# Patient Record
Sex: Female | Born: 1980 | Race: White | Hispanic: No | Smoking: Current every day smoker
Health system: Southern US, Community
[De-identification: ages and names within clinical notes are randomized; demographics above are authoritative.]

## PROBLEM LIST (undated history)

## (undated) DIAGNOSIS — J449 Chronic obstructive pulmonary disease, unspecified: Secondary | ICD-10-CM

## (undated) DIAGNOSIS — F419 Anxiety disorder, unspecified: Secondary | ICD-10-CM

## (undated) DIAGNOSIS — M797 Fibromyalgia: Secondary | ICD-10-CM

## (undated) DIAGNOSIS — N200 Calculus of kidney: Secondary | ICD-10-CM

## (undated) DIAGNOSIS — N390 Urinary tract infection, site not specified: Secondary | ICD-10-CM

## (undated) DIAGNOSIS — N289 Disorder of kidney and ureter, unspecified: Secondary | ICD-10-CM

## (undated) HISTORY — PX: DENTAL SURGERY: SHX609

## (undated) HISTORY — PX: EYE SURGERY: SHX253

## (undated) HISTORY — PX: TUBAL LIGATION: SHX77

---

## 2005-03-06 ENCOUNTER — Emergency Department: Payer: Self-pay | Admitting: Emergency Medicine

## 2005-04-11 ENCOUNTER — Emergency Department: Payer: Self-pay | Admitting: Unknown Physician Specialty

## 2005-05-12 ENCOUNTER — Inpatient Hospital Stay (HOSPITAL_COMMUNITY): Admission: AD | Admit: 2005-05-12 | Discharge: 2005-05-12 | Payer: Self-pay | Admitting: Obstetrics and Gynecology

## 2006-02-20 ENCOUNTER — Ambulatory Visit: Payer: Self-pay | Admitting: Obstetrics & Gynecology

## 2006-03-06 ENCOUNTER — Ambulatory Visit: Payer: Self-pay | Admitting: Obstetrics & Gynecology

## 2006-03-19 ENCOUNTER — Ambulatory Visit (HOSPITAL_COMMUNITY): Admission: RE | Admit: 2006-03-19 | Discharge: 2006-03-19 | Payer: Self-pay | Admitting: Obstetrics and Gynecology

## 2006-04-05 ENCOUNTER — Other Ambulatory Visit: Payer: Self-pay

## 2006-04-05 ENCOUNTER — Emergency Department: Payer: Self-pay | Admitting: Emergency Medicine

## 2008-02-26 ENCOUNTER — Emergency Department: Payer: Self-pay | Admitting: Emergency Medicine

## 2009-02-14 ENCOUNTER — Ambulatory Visit: Payer: Self-pay | Admitting: Family Medicine

## 2009-05-10 ENCOUNTER — Emergency Department: Payer: Self-pay | Admitting: Emergency Medicine

## 2009-08-07 ENCOUNTER — Ambulatory Visit: Payer: Self-pay | Admitting: Internal Medicine

## 2010-05-08 ENCOUNTER — Emergency Department: Payer: Self-pay | Admitting: Emergency Medicine

## 2010-06-28 ENCOUNTER — Emergency Department: Payer: Self-pay | Admitting: Internal Medicine

## 2010-07-21 ENCOUNTER — Emergency Department: Payer: Self-pay | Admitting: Emergency Medicine

## 2010-10-05 ENCOUNTER — Emergency Department: Payer: Self-pay | Admitting: Emergency Medicine

## 2010-10-29 ENCOUNTER — Encounter: Payer: Self-pay | Admitting: Obstetrics and Gynecology

## 2010-11-28 ENCOUNTER — Observation Stay: Payer: Self-pay

## 2011-02-16 ENCOUNTER — Observation Stay: Payer: Self-pay | Admitting: Obstetrics and Gynecology

## 2011-03-02 ENCOUNTER — Observation Stay: Payer: Self-pay | Admitting: Obstetrics & Gynecology

## 2011-10-14 ENCOUNTER — Emergency Department: Payer: Self-pay | Admitting: Unknown Physician Specialty

## 2012-01-15 ENCOUNTER — Emergency Department: Payer: Self-pay | Admitting: Emergency Medicine

## 2012-01-15 LAB — COMPREHENSIVE METABOLIC PANEL
Albumin: 4.2 g/dL (ref 3.4–5.0)
Alkaline Phosphatase: 50 U/L (ref 50–136)
Anion Gap: 9 (ref 7–16)
BUN: 11 mg/dL (ref 7–18)
Bilirubin,Total: 0.3 mg/dL (ref 0.2–1.0)
Calcium, Total: 9.2 mg/dL (ref 8.5–10.1)
Chloride: 105 mmol/L (ref 98–107)
Co2: 27 mmol/L (ref 21–32)
Creatinine: 0.7 mg/dL (ref 0.60–1.30)
EGFR (African American): >60
EGFR (Non-African Amer.): >60
Glucose: 95 mg/dL (ref 65–99)
Osmolality: 280 (ref 275–301)
Potassium: 3.7 mmol/L (ref 3.5–5.1)
SGOT(AST): 20 U/L (ref 15–37)
SGPT (ALT): 29 U/L
Sodium: 141 mmol/L (ref 136–145)
Total Protein: 7.9 g/dL (ref 6.4–8.2)

## 2012-01-15 LAB — URINALYSIS, COMPLETE
Bilirubin,UR: NEGATIVE
Blood: NEGATIVE
Glucose,UR: NEGATIVE mg/dL (ref 0–75)
Ketone: NEGATIVE
Nitrite: POSITIVE
Ph: 6 (ref 4.5–8.0)
Protein: NEGATIVE
RBC,UR: 6 /HPF (ref 0–5)
Specific Gravity: 1.019 (ref 1.003–1.030)
Squamous Epithelial: 4
WBC UR: 28 /HPF (ref 0–5)

## 2012-01-15 LAB — CBC
HCT: 39.1 % (ref 35.0–47.0)
HGB: 13.3 g/dL (ref 12.0–16.0)
MCH: 32.2 pg (ref 26.0–34.0)
MCHC: 33.9 g/dL (ref 32.0–36.0)
MCV: 95 fL (ref 80–100)
Platelet: 331 10*3/uL (ref 150–440)
RBC: 4.13 10*6/uL (ref 3.80–5.20)
RDW: 14.6 % — ABNORMAL HIGH (ref 11.5–14.5)
WBC: 14.7 10*3/uL — ABNORMAL HIGH (ref 3.6–11.0)

## 2012-01-15 LAB — PREGNANCY, URINE: Pregnancy Test, Urine: NEGATIVE m[IU]/mL

## 2012-01-15 LAB — LIPASE, BLOOD: Lipase: 148 U/L (ref 73–393)

## 2012-02-12 ENCOUNTER — Emergency Department: Payer: Self-pay | Admitting: Emergency Medicine

## 2012-07-28 ENCOUNTER — Emergency Department: Payer: Self-pay | Admitting: Emergency Medicine

## 2012-08-01 LAB — WOUND CULTURE

## 2012-08-24 DIAGNOSIS — K589 Irritable bowel syndrome without diarrhea: Secondary | ICD-10-CM | POA: Insufficient documentation

## 2012-09-14 ENCOUNTER — Emergency Department: Payer: Self-pay | Admitting: Emergency Medicine

## 2012-09-14 LAB — URINALYSIS, COMPLETE
Bacteria: NONE SEEN
Bilirubin,UR: NEGATIVE
Blood: NEGATIVE
Glucose,UR: NEGATIVE mg/dL (ref 0–75)
Ketone: NEGATIVE
Nitrite: NEGATIVE
Ph: 6 (ref 4.5–8.0)
Protein: NEGATIVE
RBC,UR: 6 /HPF (ref 0–5)
Specific Gravity: 1.024 (ref 1.003–1.030)
Squamous Epithelial: 3
WBC UR: 5 /HPF (ref 0–5)

## 2012-11-27 ENCOUNTER — Emergency Department: Payer: Self-pay | Admitting: Emergency Medicine

## 2012-11-27 LAB — COMPREHENSIVE METABOLIC PANEL
Albumin: 4 g/dL (ref 3.4–5.0)
Alkaline Phosphatase: 59 U/L (ref 50–136)
Anion Gap: 7 (ref 7–16)
BUN: 11 mg/dL (ref 7–18)
Bilirubin,Total: 0.5 mg/dL (ref 0.2–1.0)
Calcium, Total: 8.8 mg/dL (ref 8.5–10.1)
Chloride: 108 mmol/L — ABNORMAL HIGH (ref 98–107)
Co2: 27 mmol/L (ref 21–32)
Creatinine: 0.74 mg/dL (ref 0.60–1.30)
EGFR (African American): >60
EGFR (Non-African Amer.): >60
Glucose: 89 mg/dL (ref 65–99)
Osmolality: 282 (ref 275–301)
Potassium: 3.5 mmol/L (ref 3.5–5.1)
SGOT(AST): 24 U/L (ref 15–37)
SGPT (ALT): 25 U/L (ref 12–78)
Sodium: 142 mmol/L (ref 136–145)
Total Protein: 7.5 g/dL (ref 6.4–8.2)

## 2012-11-27 LAB — URINALYSIS, COMPLETE
Bacteria: NONE SEEN
Bilirubin,UR: NEGATIVE
Blood: NEGATIVE
Glucose,UR: NEGATIVE mg/dL (ref 0–75)
Nitrite: NEGATIVE
Ph: 6 (ref 4.5–8.0)
Protein: NEGATIVE
RBC,UR: 7 /HPF (ref 0–5)
Specific Gravity: 1.024 (ref 1.003–1.030)
Squamous Epithelial: 15
WBC UR: 35 /HPF (ref 0–5)

## 2012-11-27 LAB — CBC
HCT: 38.1 % (ref 35.0–47.0)
HGB: 13.5 g/dL (ref 12.0–16.0)
MCH: 33.8 pg (ref 26.0–34.0)
MCHC: 35.4 g/dL (ref 32.0–36.0)
MCV: 95 fL (ref 80–100)
Platelet: 309 10*3/uL (ref 150–440)
RBC: 4 10*6/uL (ref 3.80–5.20)
RDW: 13.4 % (ref 11.5–14.5)
WBC: 10.1 10*3/uL (ref 3.6–11.0)

## 2012-11-27 LAB — PREGNANCY, URINE: Pregnancy Test, Urine: NEGATIVE m[IU]/mL

## 2012-11-27 LAB — RAPID INFLUENZA A&B ANTIGENS

## 2012-12-22 DIAGNOSIS — M545 Low back pain, unspecified: Secondary | ICD-10-CM | POA: Insufficient documentation

## 2013-01-05 DIAGNOSIS — K625 Hemorrhage of anus and rectum: Secondary | ICD-10-CM | POA: Insufficient documentation

## 2013-06-21 DIAGNOSIS — M797 Fibromyalgia: Secondary | ICD-10-CM | POA: Insufficient documentation

## 2013-07-15 ENCOUNTER — Emergency Department: Payer: Self-pay | Admitting: Emergency Medicine

## 2013-07-15 LAB — URINALYSIS, COMPLETE
Bacteria: NONE SEEN
Bilirubin,UR: NEGATIVE
Blood: NEGATIVE
Glucose,UR: NEGATIVE mg/dL (ref 0–75)
Ketone: NEGATIVE
Nitrite: NEGATIVE
Ph: 6 (ref 4.5–8.0)
Protein: NEGATIVE
RBC,UR: 2 /HPF (ref 0–5)
Specific Gravity: 1.018 (ref 1.003–1.030)
Squamous Epithelial: 4
WBC UR: 6 /HPF (ref 0–5)

## 2013-07-15 LAB — COMPREHENSIVE METABOLIC PANEL
Albumin: 4.2 g/dL (ref 3.4–5.0)
Alkaline Phosphatase: 46 U/L — ABNORMAL LOW (ref 50–136)
Anion Gap: 4 — ABNORMAL LOW (ref 7–16)
BUN: 8 mg/dL (ref 7–18)
Bilirubin,Total: 0.4 mg/dL (ref 0.2–1.0)
Calcium, Total: 9.3 mg/dL (ref 8.5–10.1)
Chloride: 104 mmol/L (ref 98–107)
Co2: 30 mmol/L (ref 21–32)
Creatinine: 0.76 mg/dL (ref 0.60–1.30)
EGFR (African American): >60
EGFR (Non-African Amer.): >60
Glucose: 87 mg/dL (ref 65–99)
Osmolality: 273 (ref 275–301)
Potassium: 3.9 mmol/L (ref 3.5–5.1)
SGOT(AST): 21 U/L (ref 15–37)
SGPT (ALT): 25 U/L (ref 12–78)
Sodium: 138 mmol/L (ref 136–145)
Total Protein: 7.7 g/dL (ref 6.4–8.2)

## 2013-07-15 LAB — CBC
HCT: 39 % (ref 35.0–47.0)
HGB: 13.5 g/dL (ref 12.0–16.0)
MCH: 33.2 pg (ref 26.0–34.0)
MCHC: 34.6 g/dL (ref 32.0–36.0)
MCV: 96 fL (ref 80–100)
Platelet: 270 10*3/uL (ref 150–440)
RBC: 4.07 10*6/uL (ref 3.80–5.20)
RDW: 13.3 % (ref 11.5–14.5)
WBC: 11.1 10*3/uL — ABNORMAL HIGH (ref 3.6–11.0)

## 2013-07-16 LAB — WET PREP, GENITAL

## 2013-07-16 LAB — GC/CHLAMYDIA PROBE AMP

## 2013-11-17 ENCOUNTER — Emergency Department: Payer: Self-pay | Admitting: Emergency Medicine

## 2013-11-20 ENCOUNTER — Emergency Department: Payer: Self-pay | Admitting: Emergency Medicine

## 2013-11-20 LAB — CBC
HCT: 38.8 % (ref 35.0–47.0)
HGB: 13.3 g/dL (ref 12.0–16.0)
MCH: 33.3 pg (ref 26.0–34.0)
MCHC: 34.2 g/dL (ref 32.0–36.0)
MCV: 97 fL (ref 80–100)
Platelet: 298 10*3/uL (ref 150–440)
RBC: 3.99 10*6/uL (ref 3.80–5.20)
RDW: 13 % (ref 11.5–14.5)
WBC: 10.8 10*3/uL (ref 3.6–11.0)

## 2013-11-21 LAB — URINALYSIS, COMPLETE
Bilirubin,UR: NEGATIVE
Glucose,UR: NEGATIVE mg/dL (ref 0–75)
Ketone: NEGATIVE
Nitrite: NEGATIVE
Ph: 8 (ref 4.5–8.0)
Protein: 100
RBC,UR: 4527 /HPF (ref 0–5)
Specific Gravity: 1.017 (ref 1.003–1.030)
Squamous Epithelial: NONE SEEN
WBC UR: 62 /HPF (ref 0–5)

## 2013-12-21 DIAGNOSIS — F329 Major depressive disorder, single episode, unspecified: Secondary | ICD-10-CM | POA: Insufficient documentation

## 2013-12-21 DIAGNOSIS — F32A Depression, unspecified: Secondary | ICD-10-CM | POA: Insufficient documentation

## 2014-03-28 DIAGNOSIS — E669 Obesity, unspecified: Secondary | ICD-10-CM | POA: Insufficient documentation

## 2014-05-30 DIAGNOSIS — F410 Panic disorder [episodic paroxysmal anxiety] without agoraphobia: Secondary | ICD-10-CM | POA: Insufficient documentation

## 2014-10-04 ENCOUNTER — Emergency Department: Payer: Self-pay | Admitting: Emergency Medicine

## 2014-10-04 LAB — URINALYSIS, COMPLETE
Bacteria: NONE SEEN
Bilirubin,UR: NEGATIVE
Glucose,UR: NEGATIVE mg/dL (ref 0–75)
Ketone: NEGATIVE
Leukocyte Esterase: NEGATIVE
Nitrite: NEGATIVE
Ph: 7 (ref 4.5–8.0)
Protein: 30
RBC,UR: 1614 /HPF (ref 0–5)
Specific Gravity: 1.015 (ref 1.003–1.030)
Squamous Epithelial: 4
WBC UR: 9 /HPF (ref 0–5)

## 2014-10-04 LAB — COMPREHENSIVE METABOLIC PANEL
Albumin: 4.1 g/dL (ref 3.4–5.0)
Alkaline Phosphatase: 43 U/L — ABNORMAL LOW
Anion Gap: 3 — ABNORMAL LOW (ref 7–16)
BUN: 8 mg/dL (ref 7–18)
Bilirubin,Total: 0.5 mg/dL (ref 0.2–1.0)
Calcium, Total: 8.5 mg/dL (ref 8.5–10.1)
Chloride: 109 mmol/L — ABNORMAL HIGH (ref 98–107)
Co2: 31 mmol/L (ref 21–32)
Creatinine: 0.84 mg/dL (ref 0.60–1.30)
EGFR (African American): >60
EGFR (Non-African Amer.): >60
Glucose: 90 mg/dL (ref 65–99)
Osmolality: 283 (ref 275–301)
Potassium: 3.7 mmol/L (ref 3.5–5.1)
SGOT(AST): 22 U/L (ref 15–37)
SGPT (ALT): 24 U/L
Sodium: 143 mmol/L (ref 136–145)
Total Protein: 7.4 g/dL (ref 6.4–8.2)

## 2014-10-04 LAB — CBC
HCT: 38.9 % (ref 35.0–47.0)
HGB: 13 g/dL (ref 12.0–16.0)
MCH: 32.3 pg (ref 26.0–34.0)
MCHC: 33.4 g/dL (ref 32.0–36.0)
MCV: 97 fL (ref 80–100)
Platelet: 313 10*3/uL (ref 150–440)
RBC: 4.03 10*6/uL (ref 3.80–5.20)
RDW: 13.9 % (ref 11.5–14.5)
WBC: 11 10*3/uL (ref 3.6–11.0)

## 2014-10-04 LAB — LIPASE, BLOOD: Lipase: 150 U/L (ref 73–393)

## 2014-10-04 LAB — WET PREP, GENITAL

## 2014-10-04 LAB — HCG, QUANTITATIVE, PREGNANCY: Beta Hcg, Quant.: <1 m[IU]/mL — ABNORMAL LOW

## 2014-10-04 LAB — GC/CHLAMYDIA PROBE AMP

## 2015-01-30 DIAGNOSIS — N200 Calculus of kidney: Secondary | ICD-10-CM | POA: Insufficient documentation

## 2015-03-19 ENCOUNTER — Emergency Department
Admit: 2015-03-19 | Disposition: A | Discharge status: Home / Self Care | Payer: Self-pay | Admitting: Emergency Medicine

## 2015-03-19 LAB — URINALYSIS, COMPLETE
Bilirubin,UR: NEGATIVE
Blood: NEGATIVE
Glucose,UR: NEGATIVE mg/dL (ref 0–75)
Ketone: NEGATIVE
Nitrite: NEGATIVE
Ph: 7 (ref 4.5–8.0)
Protein: NEGATIVE
Specific Gravity: 1.014 (ref 1.003–1.030)

## 2015-03-19 LAB — COMPREHENSIVE METABOLIC PANEL
Albumin: 4.2 g/dL
Alkaline Phosphatase: 35 U/L — ABNORMAL LOW
Anion Gap: 8 (ref 7–16)
BUN: 9 mg/dL
Bilirubin,Total: 0.5 mg/dL
Calcium, Total: 9.1 mg/dL
Chloride: 107 mmol/L
Co2: 27 mmol/L
Creatinine: 0.67 mg/dL
EGFR (African American): >60
EGFR (Non-African Amer.): >60
Glucose: 88 mg/dL
Potassium: 3.7 mmol/L
SGOT(AST): 24 U/L
SGPT (ALT): 17 U/L
Sodium: 142 mmol/L
Total Protein: 7 g/dL

## 2015-03-19 LAB — CBC
HCT: 39.9 % (ref 35.0–47.0)
HGB: 13.2 g/dL (ref 12.0–16.0)
MCH: 31.3 pg (ref 26.0–34.0)
MCHC: 33.2 g/dL (ref 32.0–36.0)
MCV: 94 fL (ref 80–100)
Platelet: 277 10*3/uL (ref 150–440)
RBC: 4.23 10*6/uL (ref 3.80–5.20)
RDW: 15.2 % — ABNORMAL HIGH (ref 11.5–14.5)
WBC: 9.3 10*3/uL (ref 3.6–11.0)

## 2015-11-13 IMAGING — US US PELV - US TRANSVAGINAL
1 series · 14 of 25 positions shown · non-contrast
Comparison: Pelvic ultrasound performed 10/06/2010

CLINICAL DATA: Vaginal bleeding and pelvic pain.

EXAM:
TRANSABDOMINAL AND TRANSVAGINAL ULTRASOUND OF PELVIS
TECHNIQUE: Both transabdominal and transvaginal ultrasound examinations of the
pelvis were performed. Transabdominal technique was performed for
global imaging of the pelvis including uterus, ovaries, adnexal
regions, and pelvic cul-de-sac. It was necessary to proceed with
endovaginal exam following the transabdominal exam to visualize the
uterus and ovaries in greater detail.

[Series 1: us pelv - us transvaginal · 0.21mm/px · 14 of 43 slices shown]
[im 1/43]
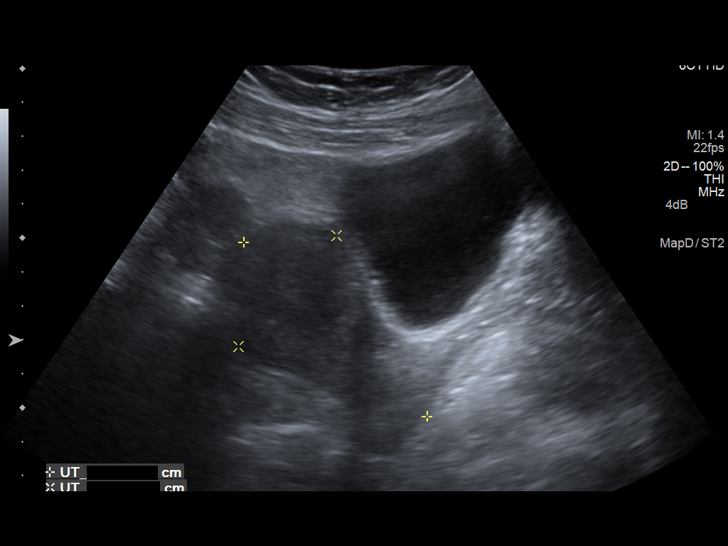
[im 4/43]
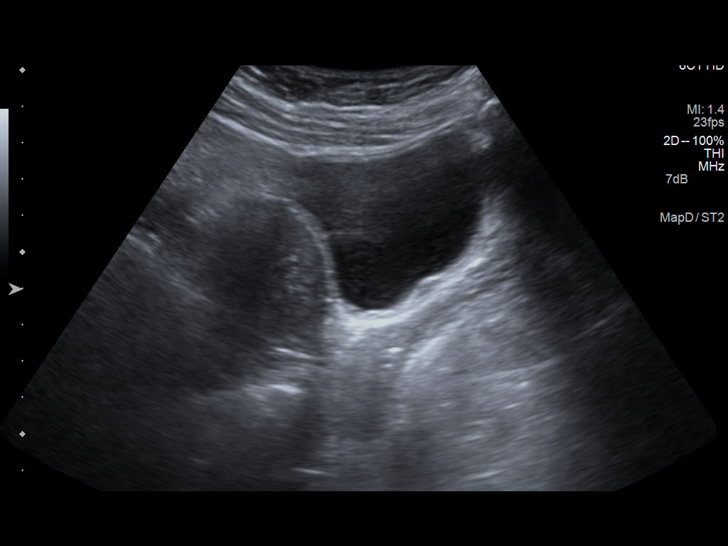
[im 8/43]
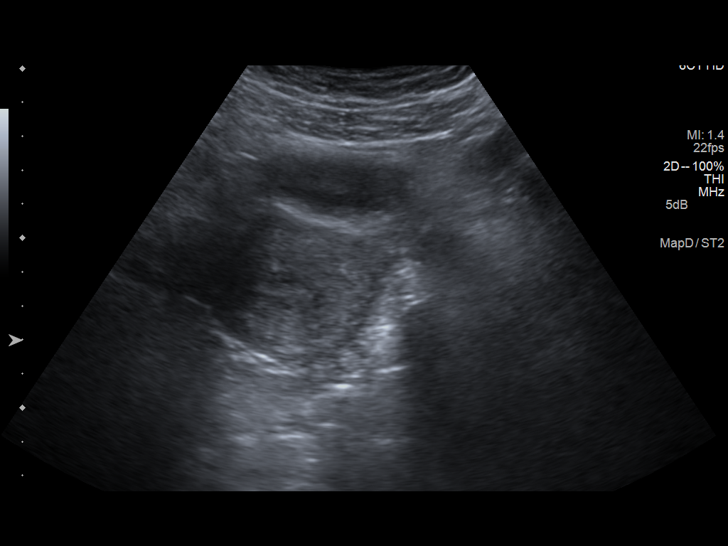
[im 11/43]
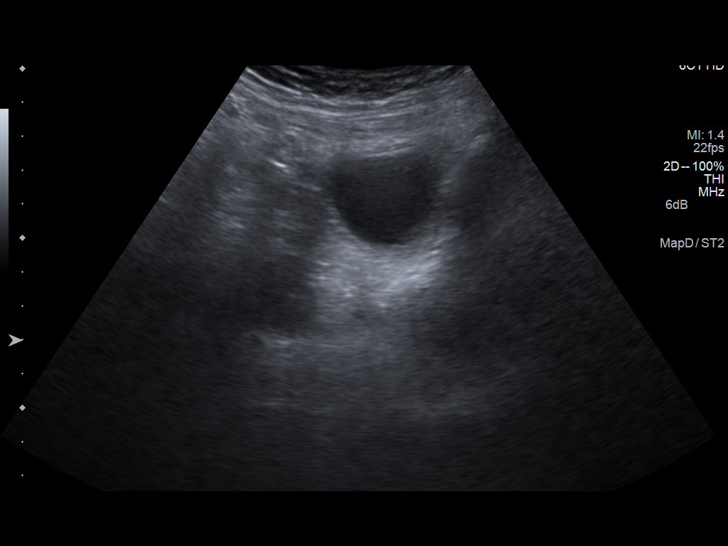
[im 15/43]
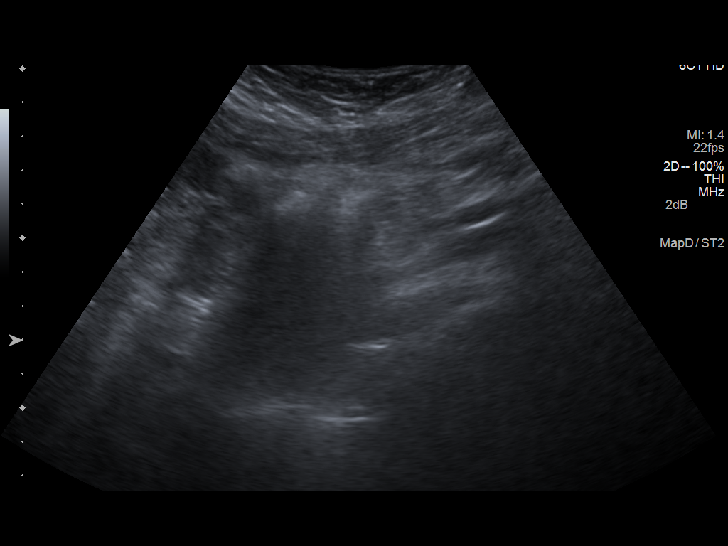
[im 16/43]
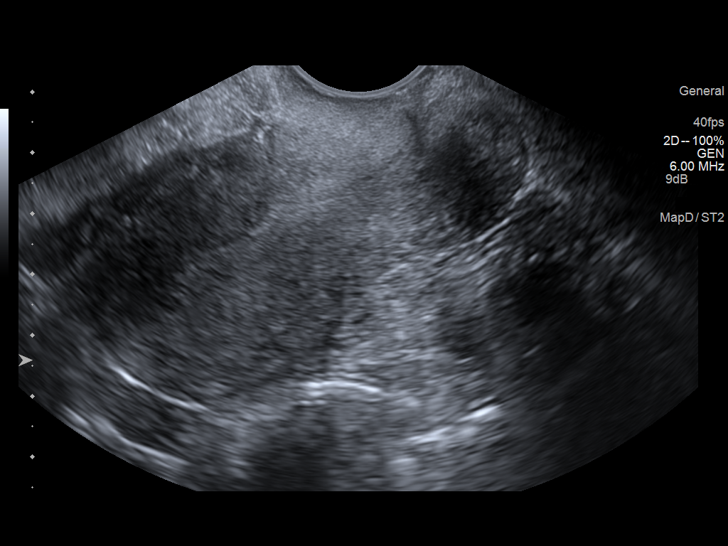
[im 20/43]
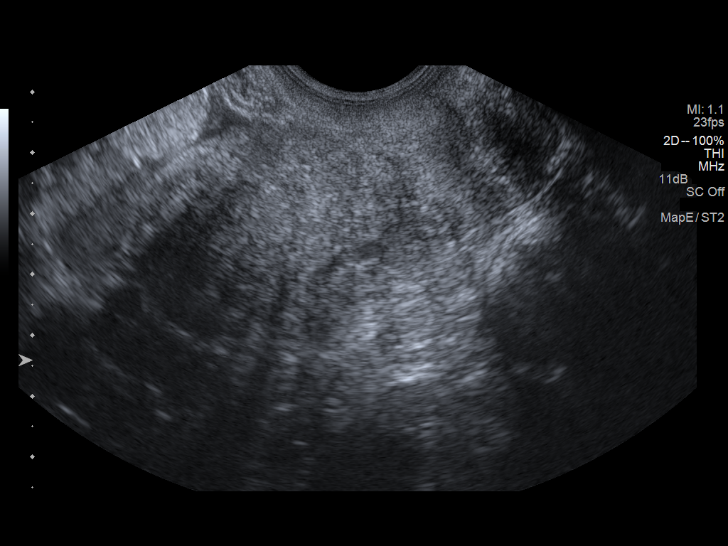
[im 23/43]
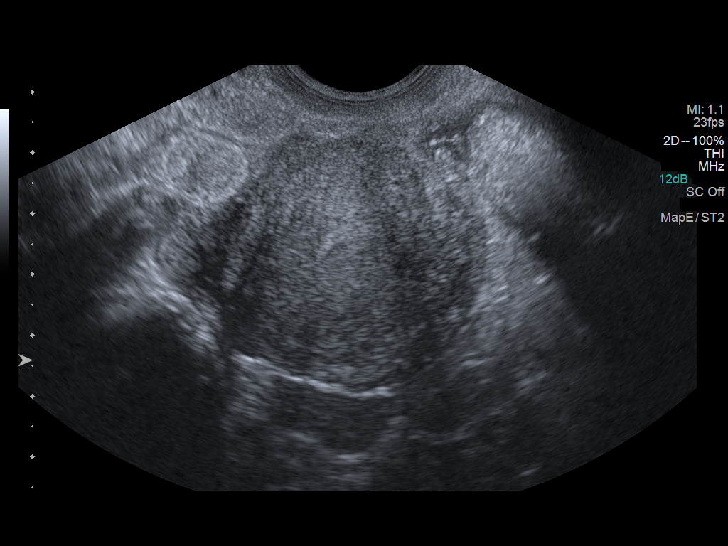
[im 27/43]
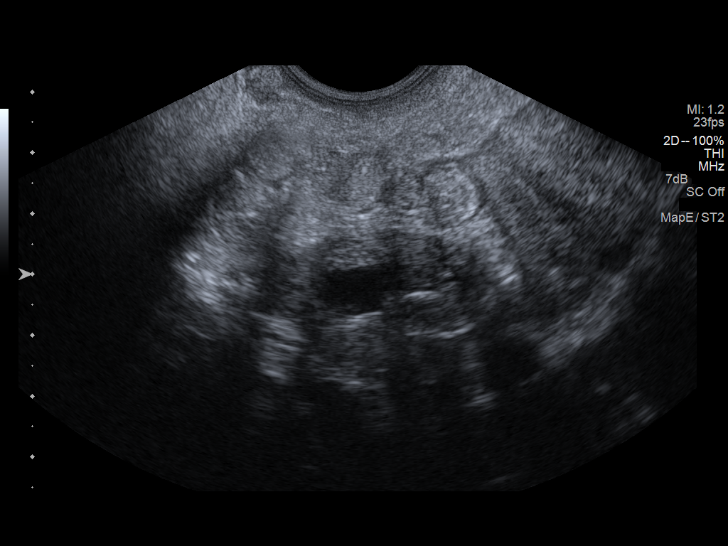
[im 29/43]
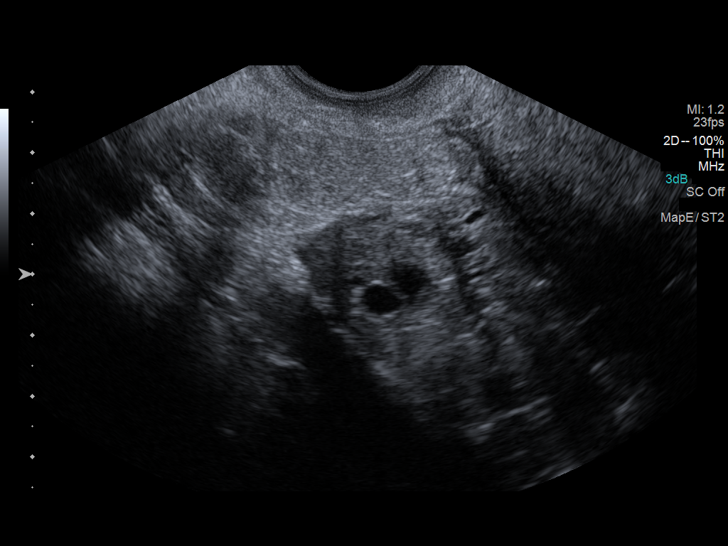
[im 32/43]
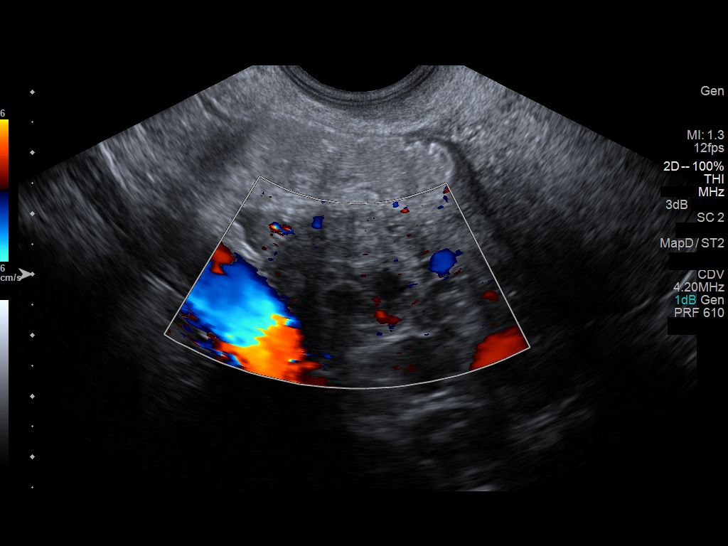
[im 36/43]
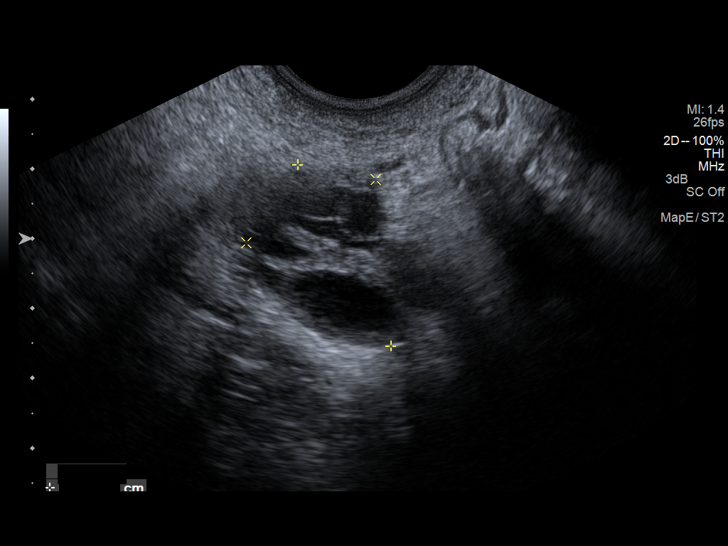
[im 39/43]
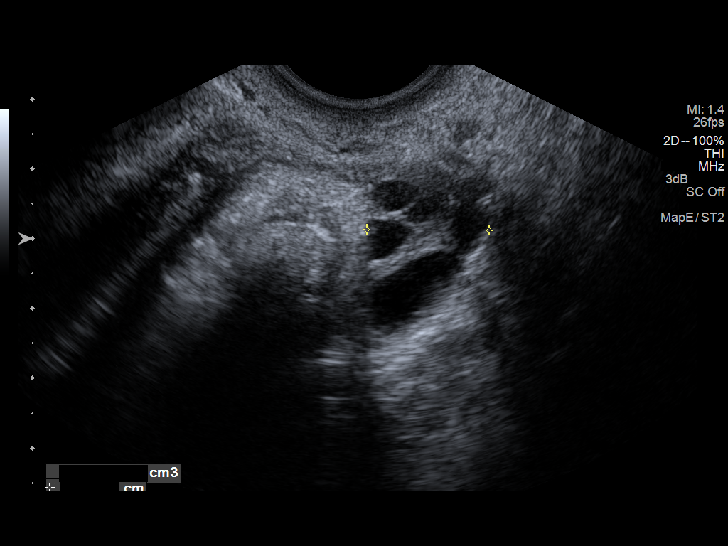
[im 43/43]
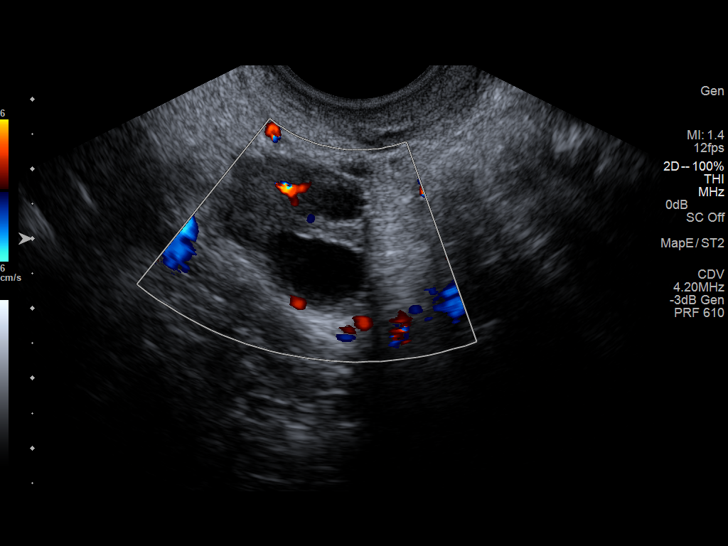

[14 of 25 positions shown; findings below may reference images not displayed]

FINDINGS: Uterus

Measurements: 7.5 x 4.4 x 4.6 cm. No fibroids or other mass
visualized.

Endometrium

Thickness: 0.5 cm.  No focal abnormality visualized.

Right ovary

Measurements: 3.0 x 2.1 x 2.8 cm. Normal appearance/no adnexal mass.

Left ovary

Measurements: 2.9 x 2.1 x 1.8 cm. Normal appearance/no adnexal mass.

Limited Doppler evaluation demonstrates normal color Doppler blood
flow with regard to both ovaries; there is no evidence for ovarian
torsion.

Other findings

No free fluid is seen within the pelvic cul-de-sac.
IMPRESSION: Unremarkable pelvic ultrasound.

## 2015-11-25 ENCOUNTER — Emergency Department: Payer: Medicaid Other

## 2015-11-25 ENCOUNTER — Encounter: Payer: Self-pay | Admitting: Emergency Medicine

## 2015-11-25 ENCOUNTER — Emergency Department
Admission: EM | Admit: 2015-11-25 | Discharge: 2015-11-25 | Disposition: A | Discharge status: Home / Self Care | Payer: Medicaid Other | Attending: Student | Admitting: Student

## 2015-11-25 DIAGNOSIS — Y998 Other external cause status: Secondary | ICD-10-CM | POA: Insufficient documentation

## 2015-11-25 DIAGNOSIS — M25532 Pain in left wrist: Secondary | ICD-10-CM

## 2015-11-25 DIAGNOSIS — F17200 Nicotine dependence, unspecified, uncomplicated: Secondary | ICD-10-CM | POA: Insufficient documentation

## 2015-11-25 DIAGNOSIS — W228XXA Striking against or struck by other objects, initial encounter: Secondary | ICD-10-CM | POA: Insufficient documentation

## 2015-11-25 DIAGNOSIS — S6992XA Unspecified injury of left wrist, hand and finger(s), initial encounter: Secondary | ICD-10-CM | POA: Insufficient documentation

## 2015-11-25 DIAGNOSIS — G43909 Migraine, unspecified, not intractable, without status migrainosus: Secondary | ICD-10-CM | POA: Insufficient documentation

## 2015-11-25 DIAGNOSIS — Y9389 Activity, other specified: Secondary | ICD-10-CM | POA: Insufficient documentation

## 2015-11-25 DIAGNOSIS — Z79899 Other long term (current) drug therapy: Secondary | ICD-10-CM | POA: Insufficient documentation

## 2015-11-25 DIAGNOSIS — Z91040 Latex allergy status: Secondary | ICD-10-CM | POA: Insufficient documentation

## 2015-11-25 DIAGNOSIS — Y9289 Other specified places as the place of occurrence of the external cause: Secondary | ICD-10-CM | POA: Insufficient documentation

## 2015-11-25 LAB — URINALYSIS COMPLETE WITH MICROSCOPIC (ARMC ONLY)
Bilirubin Urine: NEGATIVE
Glucose, UA: NEGATIVE mg/dL
Ketones, ur: NEGATIVE mg/dL
Leukocytes, UA: NEGATIVE
Nitrite: NEGATIVE
Protein, ur: NEGATIVE mg/dL
Specific Gravity, Urine: 1.019 (ref 1.005–1.030)
pH: 5 (ref 5.0–8.0)

## 2015-11-25 NOTE — ED Notes (Signed)
Reports moving a dresser to clean behind it yesterday, states now has pain in left forearm.  Also states "i have a migraine"  Pt also states that she recently had uti and want to get urine chekced.

## 2015-11-25 NOTE — ED Provider Notes (Signed)
[facility] Emergency Department Provider Note  ____________________________________________  Time seen: Approximately 8:00 PM  I have reviewed the triage vital signs and the nursing notes.   HISTORY  Chief Complaint Headache and Arm Injury    HPI Amanda [name] is a 34 y.o. female with history of fibromyalgia, right-hand dominant, who presents for evaluation of traumatic left forearm/wrist pain which began yesterday, constant since onset, worse with movement at the wrist, currently moderate. The patient reports that she was lying on her back attempting to push a piece of furniture with her legs from the piece of furniture came tumbling toward her and she reached out with her left arm to stop it from falling on top of her. She has had pain in the arm ever since. She also reports that when she first arrived to triage, she was having a migraine headache however that resolved in the waiting room. She was also concerned because of her history of urinary tract infections and was requesting a urinalysis though she has no dysuria, hematuria, fevers or chills. No nausea, vomiting, diarrhea, no numbness or weakness.   History reviewed. No pertinent past medical history.  There are no active problems to display for this patient.   History reviewed. No pertinent past surgical history.  Current Outpatient Rx  Name  Route  Sig  Dispense  Refill  . albuterol (PROVENTIL HFA;VENTOLIN HFA) 108 (90 Base) MCG/ACT inhaler   Inhalation   Inhale 2 puffs into the lungs every 6 (six) hours as needed for wheezing or shortness of breath.         . ALPRAZolam (XANAX) 1 MG tablet   Oral   Take 1 mg by mouth 3 (three) times daily as needed. For anxiety.         Marland Kitchen amphetamine-dextroamphetamine (ADDERALL) 5 MG tablet   Oral   Take 5 mg by mouth 2 (two) times daily.           Allergies Latex  History reviewed. No pertinent family history.  Social History Social  History  Substance Use Topics  . Smoking status: Current Every Day Smoker  . Smokeless tobacco: None  . Alcohol Use: None    Review of Systems Constitutional: No fever/chills Eyes: No visual changes. ENT: No sore throat. Cardiovascular: Denies chest pain. Respiratory: Denies shortness of breath. Gastrointestinal: No abdominal pain.  No nausea, no vomiting.  No diarrhea.  No constipation. Genitourinary: Negative for dysuria. Musculoskeletal: Negative for back pain. Skin: Negative for rash. Neurological: Positive for migraine headache, focal weakness or numbness.  10-point ROS otherwise negative.  ____________________________________________   PHYSICAL EXAM:  VITAL SIGNS: ED Triage Vitals  Enc Vitals Group     BP 11/25/15 1549 126/73 mmHg     Pulse Rate 11/25/15 1549 79     Resp 11/25/15 1549 16     Temp 11/25/15 1549 98 F (36.7 C)     Temp Source 11/25/15 1549 Oral     SpO2 11/25/15 1549 100 %     Weight 11/25/15 1549 175 lb (79.379 kg)     Height 11/25/15 1549  (1.6 m)     Head Cir --      Peak Flow --      Pain Score 11/25/15 1944 5     Pain Loc --      Pain Edu? --      Excl. in GC? --     Constitutional: Alert and oriented. Well appearing and in no acute distress.  Eyes: Conjunctivae are normal. PERRL. EOMI. Head: Atraumatic. Nose: No congestion/rhinnorhea. Mouth/Throat: Mucous membranes are moist.  Oropharynx non-erythematous. Neck: No stridor. Cardiovascular: Normal rate, regular rhythm. Grossly normal heart sounds.  Good peripheral circulation. Respiratory: Normal respiratory effort.  No retractions. Lungs CTAB. Gastrointestinal: Soft and nontender. No distention. No CVA tenderness. Genitourinary: deferred Musculoskeletal: Mild tenderness throughout the left wrist with tenderness over the anatomic snuffbox, painful full range of motion at the left wrist, full range of motion at the left elbow, no bony abnormality, 2+ left radial pulse, left radial,  median, ulnar nerve intact. Neurologic:  Normal speech and language. No gross focal neurologic deficits are appreciated. No gait instability. Skin:  Skin is warm, dry and intact. No rash noted. Psychiatric: Mood and affect are normal. Speech and behavior are normal.  ____________________________________________   LABS (all labs ordered are listed, but only abnormal results are displayed)  Labs Reviewed  URINALYSIS COMPLETEWITH MICROSCOPIC ([facility] ONLY) - Abnormal; Notable for the following:    Color, Urine YELLOW (*)    APPearance CLEAR (*)    Hgb urine dipstick 3+ (*)    Bacteria, UA RARE (*)    Squamous Epithelial / LPF 0-5 (*)    All other components within normal limits   ____________________________________________  EKG  none ____________________________________________  RADIOLOGY  Xray left forearm IMPRESSION: Negative.  ____________________________________________   PROCEDURES  Procedure(s) performed: None  Critical Care performed: No  ____________________________________________   INITIAL IMPRESSION / ASSESSMENT AND PLAN / ED COURSE  Pertinent labs & imaging results that were available during my care of the patient were reviewed by me and considered in my medical decision making (see chart for details).  Amanda [name] is a 34 y.o. female with history of fibromyalgia, right-hand dominant, who presents for evaluation of traumatic left forearm/wrist pain which began yesterday, constant since onset, worse with movement at the wrist, currently moderate. On exam, she is very well-appearing and in no acute distress, vital signs are stable, she is afebrile. Suspect wrist strain however she does have some tenderness over the anatomic snuffbox and so will place thumb spica and I have advised her to follow up with orthopedic surgery in 10 days for repeat x-ray to rule out for occult scaphoid fracture. She is neurovascularly intact in the left arm. X-rays are negative  for fracture dislocation. Urinalysis not consistent with infection and shows chronic microhematuria. ____________________________________________   FINAL CLINICAL IMPRESSION(S) / ED DIAGNOSES  Final diagnoses:  Wrist pain, acute, left      Amanda DossEryka A Bearett Porcaro, MD 11/25/15 2038

## 2015-11-25 NOTE — Discharge Instructions (Signed)
Keep your splint clean and dry and follow-up with Dr. Ernest PineHooten of orthopedic surgery in 10 days for repeat x-ray of the wrist to evaluate for any hidden fracture. Return to the emergency department if you develop severe or worsening pain, weakness, numbness, if the hand or arm is changing colors or for any other concerns.

## 2016-01-01 DIAGNOSIS — F988 Other specified behavioral and emotional disorders with onset usually occurring in childhood and adolescence: Secondary | ICD-10-CM | POA: Insufficient documentation

## 2018-02-04 ENCOUNTER — Emergency Department: Payer: Medicaid Other

## 2018-02-04 ENCOUNTER — Emergency Department
Admission: EM | Admit: 2018-02-04 | Discharge: 2018-02-04 | Disposition: A | Discharge status: Home / Self Care | Payer: Medicaid Other | Attending: Student in an Organized Health Care Education/Training Program | Admitting: Student in an Organized Health Care Education/Training Program

## 2018-02-04 DIAGNOSIS — Z79899 Other long term (current) drug therapy: Secondary | ICD-10-CM | POA: Insufficient documentation

## 2018-02-04 DIAGNOSIS — F17200 Nicotine dependence, unspecified, uncomplicated: Secondary | ICD-10-CM | POA: Diagnosis not present

## 2018-02-04 DIAGNOSIS — B9689 Other specified bacterial agents as the cause of diseases classified elsewhere: Secondary | ICD-10-CM | POA: Diagnosis not present

## 2018-02-04 DIAGNOSIS — N3000 Acute cystitis without hematuria: Secondary | ICD-10-CM | POA: Diagnosis not present

## 2018-02-04 DIAGNOSIS — N760 Acute vaginitis: Secondary | ICD-10-CM | POA: Insufficient documentation

## 2018-02-04 DIAGNOSIS — M25562 Pain in left knee: Secondary | ICD-10-CM | POA: Diagnosis not present

## 2018-02-04 DIAGNOSIS — Z91040 Latex allergy status: Secondary | ICD-10-CM | POA: Insufficient documentation

## 2018-02-04 DIAGNOSIS — J449 Chronic obstructive pulmonary disease, unspecified: Secondary | ICD-10-CM | POA: Diagnosis not present

## 2018-02-04 DIAGNOSIS — R300 Dysuria: Secondary | ICD-10-CM | POA: Diagnosis present

## 2018-02-04 HISTORY — DX: Anxiety disorder, unspecified: F41.9

## 2018-02-04 HISTORY — DX: Urinary tract infection, site not specified: N39.0

## 2018-02-04 HISTORY — DX: Fibromyalgia: M79.7

## 2018-02-04 HISTORY — DX: Chronic obstructive pulmonary disease, unspecified: J44.9

## 2018-02-04 HISTORY — DX: Disorder of kidney and ureter, unspecified: N28.9

## 2018-02-04 LAB — URINALYSIS, COMPLETE (UACMP) WITH MICROSCOPIC
Bilirubin Urine: NEGATIVE
Glucose, UA: NEGATIVE mg/dL
Hgb urine dipstick: NEGATIVE
Ketones, ur: NEGATIVE mg/dL
Nitrite: POSITIVE — AB
Protein, ur: NEGATIVE mg/dL
Specific Gravity, Urine: 1.019 (ref 1.005–1.030)
pH: 5 (ref 5.0–8.0)

## 2018-02-04 LAB — PREGNANCY, URINE: Preg Test, Ur: NEGATIVE

## 2018-02-04 LAB — WET PREP, GENITAL
Sperm: NONE SEEN
Trich, Wet Prep: NONE SEEN
Yeast Wet Prep HPF POC: NONE SEEN

## 2018-02-04 LAB — CBC
HCT: 38.3 % (ref 35.0–47.0)
Hemoglobin: 12.8 g/dL (ref 12.0–16.0)
MCH: 31.6 pg (ref 26.0–34.0)
MCHC: 33.5 g/dL (ref 32.0–36.0)
MCV: 94.3 fL (ref 80.0–100.0)
Platelets: 285 10*3/uL (ref 150–440)
RBC: 4.06 MIL/uL (ref 3.80–5.20)
RDW: 14.6 % — ABNORMAL HIGH (ref 11.5–14.5)
WBC: 11 10*3/uL (ref 3.6–11.0)

## 2018-02-04 LAB — BASIC METABOLIC PANEL
Anion gap: 11 (ref 5–15)
BUN: 12 mg/dL (ref 6–20)
CO2: 25 mmol/L (ref 22–32)
Calcium: 8.9 mg/dL (ref 8.9–10.3)
Chloride: 105 mmol/L (ref 101–111)
Creatinine, Ser: 0.73 mg/dL (ref 0.44–1.00)
GFR calc Af Amer: >60 mL/min (ref >60)
GFR calc non Af Amer: >60 mL/min (ref >60)
Glucose, Bld: 78 mg/dL (ref 65–99)
Potassium: 3.4 mmol/L — ABNORMAL LOW (ref 3.5–5.1)
Sodium: 141 mmol/L (ref 135–145)

## 2018-02-04 MED ORDER — CEPHALEXIN 500 MG PO CAPS
500.0000 mg | ORAL_CAPSULE | Freq: Once | ORAL | Status: AC
  Administered 2018-02-04: 500 mg via ORAL

## 2018-02-04 MED ORDER — METRONIDAZOLE 500 MG PO TABS: 500.0000 mg | ORAL_TABLET | Freq: Two times a day (BID) | ORAL | 0 refills | Status: AC

## 2018-02-04 MED ORDER — HYDROCODONE-ACETAMINOPHEN 5-325 MG PO TABS
1.0000 | ORAL_TABLET | Freq: Once | ORAL | Status: AC
  Administered 2018-02-04: 1 via ORAL

## 2018-02-04 MED ORDER — CEPHALEXIN 500 MG PO CAPS: 500.0000 mg | ORAL_CAPSULE | Freq: Three times a day (TID) | ORAL | 0 refills | Status: AC

## 2018-02-04 MED ORDER — PROMETHAZINE HCL 12.5 MG PO TABS: 12.5000 mg | ORAL_TABLET | Freq: Four times a day (QID) | ORAL | 0 refills | Status: DC | PRN

## 2018-02-04 MED ORDER — HYDROCODONE-ACETAMINOPHEN 5-325 MG PO TABS
ORAL_TABLET | ORAL | Status: AC
  Administered 2018-02-04: 1 via ORAL
  Filled 2018-02-04: qty 1

## 2018-02-04 MED ORDER — CEPHALEXIN 500 MG PO CAPS
ORAL_CAPSULE | ORAL | Status: AC
  Filled 2018-02-04: qty 1

## 2018-02-04 NOTE — ED Provider Notes (Signed)
[facility] Emergency Department Provider Note    None    (approximate)  I have reviewed the triage vital signs and the nursing notes.   HISTORY  Chief Complaint Flank Pain (bilateral) and Knee Pain (left)    HPI Amanda [name] is a 37 y.o. female presents with chief complaint of several days of dysuria and feeling that she passed stones that she does have a history of kidney stones as well as darker colored urine and low back pain.  Does have suprapubic pain as well.  Patient is also concerned that she has sexual transmitted infection as she just recently got back with her husband.  Denies any chest pain or shortness of breath.  Also wants her left knee evaluated because she thinks that she has fluid on her knee.  Describes her pain is mild to moderate.  Past Medical History:  Diagnosis Date  . Anxiety   . COPD (chronic obstructive pulmonary disease) (HCC)   . Fibromyalgia   . Renal disorder   . UTI (urinary tract infection)    No family history on file. Past Surgical History:  Procedure Laterality Date  . CESAREAN SECTION    . DENTAL SURGERY    . EYE SURGERY    . TUBAL LIGATION     There are no active problems to display for this patient.     Prior to Admission medications   Medication Sig Start Date End Date Taking? Authorizing Provider  albuterol (PROVENTIL HFA;VENTOLIN HFA) 108 (90 Base) MCG/ACT inhaler Inhale 2 puffs into the lungs every 6 (six) hours as needed for wheezing or shortness of breath.    [provider]  ALPRAZolam Prudy Feeler(XANAX) 1 MG tablet Take 1 mg by mouth 3 (three) times daily as needed. For anxiety. 11/08/15   [provider]  amphetamine-dextroamphetamine (ADDERALL) 5 MG tablet Take 5 mg by mouth 2 (two) times daily. 11/08/15   [provider]  cephALEXin (KEFLEX) 500 MG capsule Take 1 capsule (500 mg total) by mouth 3 (three) times daily for 7 days. 02/04/18 02/11/18  Willy Eddyobinson, Mikaylee Arseneau, MD    metroNIDAZOLE (FLAGYL) 500 MG tablet Take 1 tablet (500 mg total) by mouth 2 (two) times daily for 7 days. 02/04/18 02/11/18  Willy Eddyobinson, Akshith Moncus, MD  promethazine (PHENERGAN) 12.5 MG tablet Take 1 tablet (12.5 mg total) by mouth every 6 (six) hours as needed for nausea or vomiting. 02/04/18   Willy Eddyobinson, Tamyka Bezio, MD    Allergies Latex    Social History Social History   Tobacco Use  . Smoking status: Current Every Day Smoker  . Smokeless tobacco: Never Used  Substance Use Topics  . Alcohol use: Yes  . Drug use: Not on file    Review of Systems Patient denies headaches, rhinorrhea, blurry vision, numbness, shortness of breath, chest pain, edema, cough, abdominal pain, nausea, vomiting, diarrhea, dysuria, fevers, rashes or hallucinations unless otherwise stated above in HPI. ____________________________________________   PHYSICAL EXAM:  VITAL SIGNS: Vitals:   02/04/18 2026  BP: (!) 142/92  Pulse: 97  Resp: 18  Temp: 98.8 F (37.1 C)  SpO2: 100%    Constitutional: Alert and oriented. Well appearing and in no acute distress. Eyes: Conjunctivae are normal.  Head: Atraumatic. Nose: No congestion/rhinnorhea. Mouth/Throat: Mucous membranes are moist.   Neck: No stridor. Painless ROM.  Cardiovascular: Normal rate, regular rhythm. Grossly normal heart sounds.  Good peripheral circulation. Respiratory: Normal respiratory effort.  No retractions. Lungs CTAB. Gastrointestinal: Soft and nontender. No distention. No  abdominal bruits. bilateral CVA tenderness. Genitourinary: deferred Musculoskeletal: No lower extremity tenderness nor edema.  No joint effusions. Neurologic:  Normal speech and language. No gross focal neurologic deficits are appreciated. No facial droop Skin:  Skin is warm, dry and intact. No rash noted. Psychiatric: Mood and affect are normal. Speech and behavior are normal.  ____________________________________________   LABS (all labs ordered are listed, but only  abnormal results are displayed)  Results for orders placed or performed during the hospital encounter of 02/04/18 (from the past 24 hour(s))  Urinalysis, Complete w Microscopic     Status: Abnormal   Collection Time: 02/04/18  8:31 PM  Result Value Ref Range   Color, Urine YELLOW (A) YELLOW   APPearance CLOUDY (A) CLEAR   Specific Gravity, Urine 1.019 1.005 - 1.030   pH 5.0 5.0 - 8.0   Glucose, UA NEGATIVE NEGATIVE mg/dL   Hgb urine dipstick NEGATIVE NEGATIVE   Bilirubin Urine NEGATIVE NEGATIVE   Ketones, ur NEGATIVE NEGATIVE mg/dL   Protein, ur NEGATIVE NEGATIVE mg/dL   Nitrite POSITIVE (A) NEGATIVE   Leukocytes, UA MODERATE (A) NEGATIVE   RBC / HPF 6-30 0 - 5 RBC/hpf   WBC, UA TOO NUMEROUS TO COUNT 0 - 5 WBC/hpf   Bacteria, UA MANY (A) NONE SEEN   Squamous Epithelial / LPF 6-30 (A) NONE SEEN   WBC Clumps PRESENT    Mucus PRESENT   Pregnancy, urine     Status: None   Collection Time: 02/04/18  8:31 PM  Result Value Ref Range   Preg Test, Ur NEGATIVE NEGATIVE  CBC     Status: Abnormal   Collection Time: 02/04/18  8:37 PM  Result Value Ref Range   WBC 11.0 3.6 - 11.0 K/uL   RBC 4.06 3.80 - 5.20 MIL/uL   Hemoglobin 12.8 12.0 - 16.0 g/dL   HCT 82.9 56.2 - 13.0 %   MCV 94.3 80.0 - 100.0 fL   MCH 31.6 26.0 - 34.0 pg   MCHC 33.5 32.0 - 36.0 g/dL   RDW 86.5 (H) 78.4 - 69.6 %   Platelets 285 150 - 440 K/uL  Basic metabolic panel     Status: Abnormal   Collection Time: 02/04/18  8:37 PM  Result Value Ref Range   Sodium 141 135 - 145 mmol/L   Potassium 3.4 (L) 3.5 - 5.1 mmol/L   Chloride 105 101 - 111 mmol/L   CO2 25 22 - 32 mmol/L   Glucose, Bld 78 65 - 99 mg/dL   BUN 12 6 - 20 mg/dL   Creatinine, Ser 2.95 0.44 - 1.00 mg/dL   Calcium 8.9 8.9 - 28.4 mg/dL   GFR calc non Af Amer >60 >60 mL/min   GFR calc Af Amer >60 >60 mL/min   Anion gap 11 5 - 15  Wet prep, genital     Status: Abnormal   Collection Time: 02/04/18 10:08 PM  Result Value Ref Range   Yeast Wet Prep HPF POC  NONE SEEN NONE SEEN   Trich, Wet Prep NONE SEEN NONE SEEN   Clue Cells Wet Prep HPF POC PRESENT (A) NONE SEEN   WBC, Wet Prep HPF POC FEW (A) NONE SEEN   Sperm NONE SEEN    ____________________________________________  ____________________________________________  RADIOLOGY   I personally reviewed all radiographic images ordered to evaluate for the above acute complaints and reviewed radiology reports and findings.  These findings were personally discussed with the patient.  Please see medical record for radiology report.  ____________________________________________   PROCEDURES  Procedure(s) performed:  Procedures    Critical Care performed: o ____________________________________________   INITIAL IMPRESSION / ASSESSMENT AND PLAN / ED COURSE  Pertinent labs & imaging results that were available during my care of the patient were reviewed by me and considered in my medical decision making (see chart for details).  DDX: UTI, Pilo, pregnancy, cystitis, STI, PID, fracture, dislocation.  SHARNIKA BINNEY is a 37 y.o. who presents to the ED with symptoms as described above.  Patient nontoxic appearing but does have some discomfort.  Urinalysis does show evidence of acute cystitis without evidence of hematuria.  This does not seem clinically consistent with stone.  Patient otherwise well-appearing.  Offered pelvic exam but patient declined this and instead has elected for self swab to evaluate for evidence of STI.  Clinically her symptoms seem more clinically consistent with acute cystitis.  Her abdominal exam is soft and benign.  Left knee shows no effusion mild tenderness over the proximal patella.  X-rays reassuring.  PET does show evidence of DVT for which she will receive Flagyl.  Patient given dose of Keflex in the ER and tolerated this.  Do feel patient stable and appropriate for outpatient follow-up.      ____________________________________________   FINAL CLINICAL  IMPRESSION(S) / ED DIAGNOSES  Final diagnoses:  Acute pain of left knee  Acute cystitis without hematuria  Bacterial vaginosis      NEW MEDICATIONS STARTED DURING THIS VISIT:  New Prescriptions   CEPHALEXIN (KEFLEX) 500 MG CAPSULE    Take 1 capsule (500 mg total) by mouth 3 (three) times daily for 7 days.   METRONIDAZOLE (FLAGYL) 500 MG TABLET    Take 1 tablet (500 mg total) by mouth 2 (two) times daily for 7 days.   PROMETHAZINE (PHENERGAN) 12.5 MG TABLET    Take 1 tablet (12.5 mg total) by mouth every 6 (six) hours as needed for nausea or vomiting.     Note:  This document was prepared using Dragon voice recognition software and may include unintentional dictation errors.    Willy Eddy, MD 02/04/18 [phone]

## 2018-02-04 NOTE — ED Triage Notes (Signed)
Patient reports hx of kidney stones. Patient c/o bilateral flank pain. Patient reports she believes she urinated 2 stones today.  Patient c/o left knee pain. Patient believes she has fluid on left knee.   Patient reports hx of frequent UTIs. Patient believes that she currently has a UTI.   Patient reports tmax of 104 at home.

## 2018-02-04 NOTE — ED Notes (Signed)
Patient transported to X-ray 

## 2018-02-04 NOTE — Discharge Instructions (Signed)

## 2018-02-04 NOTE — ED Triage Notes (Signed)
Patient worried about possibility of STDs.

## 2018-02-05 LAB — CHLAMYDIA/NGC RT PCR (ARMC ONLY)
Chlamydia Tr: NOT DETECTED
N gonorrhoeae: NOT DETECTED

## 2018-03-13 ENCOUNTER — Encounter: Payer: Self-pay | Admitting: Gastroenterology

## 2018-03-23 ENCOUNTER — Encounter: Payer: Self-pay | Admitting: Gastroenterology

## 2018-04-14 ENCOUNTER — Ambulatory Visit: Payer: Self-pay | Admitting: Urology

## 2018-04-15 ENCOUNTER — Ambulatory Visit: Payer: Self-pay | Admitting: Urology

## 2018-04-21 ENCOUNTER — Ambulatory Visit: Payer: Self-pay | Admitting: Urology

## 2018-04-21 ENCOUNTER — Encounter: Payer: Self-pay | Admitting: Urology

## 2018-05-10 NOTE — Progress Notes (Signed)
05/13/2018 3:49 PM   Amanda [name] 01-05-1981 [id number]  Referring provider: Delton Prairie, MD [address] [facility] [place], [place] [address]  Chief Complaint  Patient presents with  . Hematuria  . Nephrolithiasis    HPI: Patient is a 37 year old Caucasian female who was referred to [place] from the [facility] for a history of nephrolithiasis.  Most recent imaging studies were performed at [facility] in 05/30/16 and noted bilateral nephrolithiasis.  She has not seen an urologist in the past.  Stone composition is unknown.    She is having frequency, urgency, dysuria, nocturia, intermittency, hesitancy, straining to urine and gross hematuria.  This has been going on for years.  Patient denies any dysuria or suprapubic/flank pain.  Patient denies any fevers, chills, nausea or vomiting.   She has bad constipation.    She has not seen an urologist in the past.      PMH: Past Medical History:  Diagnosis Date  . Anxiety   . COPD (chronic obstructive pulmonary disease) (HCC)   . Fibromyalgia   . Renal disorder   . UTI (urinary tract infection)     Surgical History: Past Surgical History:  Procedure Laterality Date  . CESAREAN SECTION    . DENTAL SURGERY    . EYE SURGERY    . TUBAL LIGATION      Home Medications:  Allergies as of 05/13/2018      Reactions   Latex Itching, Swelling      Medication List        Accurate as of 05/13/18  3:49 PM. Always use your most recent med list.          albuterol 108 (90 Base) MCG/ACT inhaler Commonly known as:  PROVENTIL HFA;VENTOLIN HFA Inhale 2 puffs into the lungs every 6 (six) hours as needed for wheezing or shortness of breath.   ALPRAZolam 1 MG tablet Commonly known as:  XANAX Take 1 mg by mouth 3 (three) times daily as needed. For anxiety.   amphetamine-dextroamphetamine 5 MG tablet Commonly known as:  ADDERALL Take 5 mg by mouth 2 (two) times daily.   promethazine 12.5 MG tablet Commonly  known as:  PHENERGAN Take 1 tablet (12.5 mg total) by mouth every 6 (six) hours as needed for nausea or vomiting.       Allergies:  Allergies  Allergen Reactions  . Latex Itching and Swelling    Family History: History reviewed. No pertinent family history.  Social History:  reports that she has been smoking.  She has never used smokeless tobacco. She reports that she drinks alcohol. Her drug history is not on file.  ROS: UROLOGY Frequent Urination?: Yes Hard to postpone urination?: Yes Burning/pain with urination?: Yes Get up at night to urinate?: Yes Leakage of urine?: No Urine stream starts and stops?: Yes Trouble starting stream?: Yes Do you have to strain to urinate?: Yes Blood in urine?: Yes Urinary tract infection?: Yes Sexually transmitted disease?: No Injury to kidneys or bladder?: No Painful intercourse?: Yes Weak stream?: No Currently pregnant?: No Vaginal bleeding?: No Last menstrual period?: n  Gastrointestinal Nausea?: No Vomiting?: No Indigestion/heartburn?: Yes Diarrhea?: No Constipation?: No  Constitutional Fever: No Night sweats?: No Weight loss?: No Fatigue?: No  Skin Skin rash/lesions?: No Itching?: No  Eyes Blurred vision?: No Double vision?: No  Ears/Nose/Throat Sore throat?: Yes Sinus problems?: No  Hematologic/Lymphatic Swollen glands?: No Easy bruising?: Yes  Cardiovascular Leg swelling?: Yes Chest pain?: Yes  Respiratory  Cough?: No Shortness of breath?: Yes  Endocrine Excessive thirst?: Yes  Musculoskeletal Back pain?: Yes Joint pain?: Yes  Neurological Headaches?: Yes Dizziness?: Yes  Psychologic Depression?: Yes Anxiety?: Yes  Physical Exam: BP 113/78 (BP Location: Right Arm, Patient Position: Sitting, Cuff Size: Large)   Pulse 91   Ht 5\' 3"  (1.6 m)   Wt 163 lb 4.8 oz (74.1 kg)   BMI 28.93 kg/m   Constitutional:  Well nourished. Alert and oriented, No acute distress. HEENT: [place] AT, moist mucus  membranes.  Trachea midline, no masses. Cardiovascular: No clubbing, cyanosis, or edema. Respiratory: Normal respiratory effort, no increased work of breathing. GI: Abdomen is soft, non tender, non distended, no abdominal masses. Liver and spleen not palpable.  No hernias appreciated.  Stool sample for occult testing is not indicated.   GU: No CVA tenderness.  No bladder fullness or masses.  Normal external genitalia, normal pubic hair distribution, no lesions.  Normal urethral meatus, no lesions, no prolapse, no discharge.   No urethral masses, tenderness and/or tenderness. No bladder fullness, tenderness or masses. Normal vagina mucosa, good estrogen effect, yeast  discharge, no lesions, good pelvic support, no cystocele or rectocele noted.  No cervical motion tenderness.  Uterus is freely mobile and non-fixed.  No adnexal/parametria masses or tenderness noted.  Anus and perineum are without rashes or lesions.    Lymph: No cervical or inguinal adenopathy. Neurologic: Grossly intact, no focal deficits, moving all 4 extremities. Psychiatric: Normal mood and affect.  Laboratory Data: Lab Results  Component Value Date   WBC 11.0 02/04/2018   HGB 12.8 02/04/2018   HCT 38.3 02/04/2018   MCV 94.3 02/04/2018   PLT 285 02/04/2018    Lab Results  Component Value Date   CREATININE 0.73 02/04/2018    No results found for: PSA  No results found for: TESTOSTERONE  No results found for: HGBA1C  No results found for: TSH  No results found for: CHOL, HDL, CHOLHDL, VLDL, LDLCALC  Lab Results  Component Value Date   AST 24 03/19/2015   Lab Results  Component Value Date   ALT 17 03/19/2015   No components found for: ALKALINEPHOPHATASE No components found for: BILIRUBINTOTAL  No results found for: ESTRADIOL  Urinalysis Many bacteria.  6-10 WBC's.  See Epic.    I have reviewed the labs.   Assessment & Plan:    1. Gross hematuria  - I explained to the patient that there are a  number of causes that can be associated with blood in the urine, such as stones, UTI's, damage to the urinary tract and/or cancer.  - At this time, I felt that the patient warranted further urologic evaluation with complaints of gross hematuriaThe AUA guidelines state that a CT urogram is the preferred imaging study to evaluate hematuria.  - I explained to the patient that a contrast material will be injected into a vein and that in rare instances, an allergic reaction can result and may even life threatening   The patient denies any allergies to contrast, iodine and/or seafood and is not taking metformin.  - Her reproductive status is tubal ligation  - Following the imaging study,  I've recommended a cystoscopy. I described how this is performed, typically in an office setting with a flexible cystoscope. We described the risks, benefits, and possible side effects, the most common of which is a minor amount of blood in the urine and/or burning which usually resolves in 24 to 48 hours.    -  The patient had the opportunity to ask questions which were answered. Based upon this discussion, the patient is willing to proceed. Therefore, I've ordered: a CT Urogram and cystoscopy.  - The patient will return following all of the above for discussion of the results.   - UA  - Urine culture  - BUN + creatinine    - if testing returns without finding an etiology for the hematuria, we will need to see the patient back yearly -if they do not have any recurrent gross hematuria or AMH they may be released from our care - if gross hematuria or AMH persist may consider referral to nephrology - if gross hematuria or AMH persists beyond two years - may consider to repeat studies based on patient's risk factors for cancer  2. Bilateral nephrolithiasis CTU pending   3. Yeast  Diflucan sent to pharmacy   Return for CT Urogram report and cystoscopy.  These notes generated with voice recognition software. I apologize for  typographical errors.  Michiel CowboySHANNON Valdez Brannan, PA-C  [facility] [address]  [address] [place], [place] [address] [phone]

## 2018-05-13 ENCOUNTER — Encounter: Payer: Self-pay | Admitting: Urology

## 2018-05-13 ENCOUNTER — Ambulatory Visit: Payer: Medicaid Other | Admitting: Urology

## 2018-05-13 VITALS — BP 113/78 | HR 91 | Ht 63.0 in | Wt 163.3 lb

## 2018-05-13 DIAGNOSIS — B3731 Acute candidiasis of vulva and vagina: Secondary | ICD-10-CM

## 2018-05-13 DIAGNOSIS — N200 Calculus of kidney: Secondary | ICD-10-CM | POA: Diagnosis not present

## 2018-05-13 DIAGNOSIS — B373 Candidiasis of vulva and vagina: Secondary | ICD-10-CM

## 2018-05-13 DIAGNOSIS — R310 Gross hematuria: Secondary | ICD-10-CM

## 2018-05-13 MED ORDER — FLUCONAZOLE 150 MG PO TABS: 150.0000 mg | ORAL_TABLET | Freq: Once | ORAL | 0 refills | Status: AC

## 2018-05-13 NOTE — Patient Instructions (Signed)
Hematuria, Adult Hematuria is blood in your urine. It can be caused by a bladder infection, kidney infection, prostate infection, kidney stone, or cancer of your urinary tract. Infections can usually be treated with medicine, and a kidney stone usually will pass through your urine. If neither of these is the cause of your hematuria, further workup to find out the reason may be needed. It is very important that you tell your health care provider about any blood you see in your urine, even if the blood stops without treatment or happens without causing pain. Blood in your urine that happens and then stops and then happens again can be a symptom of a very serious condition. Also, pain is not a symptom in the initial stages of many urinary cancers. Follow these instructions at home:  Drink lots of fluid, 3-4 quarts a day. If you have been diagnosed with an infection, cranberry juice is especially recommended, in addition to large amounts of water.  Avoid caffeine, tea, and carbonated beverages because they tend to irritate the bladder.  Avoid alcohol because it may irritate the prostate.  Take all medicines as directed by your health care provider.  If you were prescribed an antibiotic medicine, finish it all even if you start to feel better.  If you have been diagnosed with a kidney stone, follow your health care provider's instructions regarding straining your urine to catch the stone.  Empty your bladder often. Avoid holding urine for long periods of time.  After a bowel movement, women should cleanse front to back. Use each tissue only once.  Empty your bladder before and after sexual intercourse if you are a female. Contact a health care provider if:  You develop back pain.  You have a fever.  You have a feeling of sickness in your stomach (nausea) or vomiting.  Your symptoms are not better in 3 days. Return sooner if you are getting worse. Get help right away if:  You develop  severe vomiting and are unable to keep the medicine down.  You develop severe back or abdominal pain despite taking your medicines.  You begin passing a large amount of blood or clots in your urine.  You feel extremely weak or faint, or you pass out. This information is not intended to replace advice given to you by your health care provider. Make sure you discuss any questions you have with your health care provider. Document Released: 11/11/2005 Document Revised: 04/18/2016 Document Reviewed: 07/12/2013 Elsevier Interactive Patient Education  2017 Elsevier Inc.  CT Scan A CT scan (computed tomography scan) is an imaging scan. It uses X-rays and a computer to make detailed pictures of different areas inside the body. A CT scan can give more information than a regular X-ray exam. A CT scan provides data about internal organs, soft tissue structures, blood vessels, and bones. In this procedure, the pictures will be taken in a large machine that has an opening (CT scanner). Tell a health care provider about:  Any allergies you have.  All medicines you are taking, including vitamins, herbs, eye drops, creams, and over-the-counter medicines.  Any blood disorders you have.  Any surgeries you have had.  Any medical conditions you have.  Whether you are pregnant or may be pregnant. What are the risks? Generally, this is a safe procedure. However, problems may occur, including:  An allergic reaction to dyes.  Development of cancer from excessive exposure to radiation from multiple CT scans. This is rare.  What happens before   the procedure? Staying hydrated Follow instructions from your health care provider about hydration, which may include:  Up to 2 hours before the procedure - you may continue to drink clear liquids, such as water, clear fruit juice, black coffee, and plain tea.  Eating and drinking restrictions Follow instructions from your health care provider about eating and  drinking, which may include:  24 hours before the procedure - stop drinking caffeinated beverages, such as energy drinks, tea, soda, coffee, and hot chocolate.  8 hours before the procedure - stop eating heavy meals or foods such as meat, fried foods, or fatty foods.  6 hours before the procedure - stop eating light meals or foods, such as toast or cereal.  6 hours before the procedure - stop drinking milk or drinks that contain milk.  2 hours before the procedure - stop drinking clear liquids.  General instructions  Remove any jewelry.  Ask your health care provider about changing or stopping your regular medicines. This is especially important if you are taking diabetes medicines or blood thinners. What happens during the procedure?  You will lie on a table with your arms above your head.  An IV tube may be inserted into one of your veins.  The contrast dye may be injected into the IV tube. You may feel warm or have a metallic taste in your mouth.  The table you will be lying on will move into the CT scanner.  You will be able to see, hear, and talk to the person running the machine while you are in it. Follow that person's instructions.  The CT scanner will move around you to take pictures. Do not move while it is scanning. Staying still helps the scanner to get a good image.  When the best possible pictures have been taken, the machine will be turned off. The table will be moved out of the machine.  The IV tube will be removed. The procedure may vary among health care providers and hospitals. What happens after the procedure?  It is up to you to get the results of your procedure. Ask your health care provider, or the department that is doing the procedure, when your results will be ready. Summary  A CT scan is an imaging scan.  A CT scan uses X-rays and a computer to make detailed pictures of different areas of your body.  Follow instructions from your health care  provider about eating and drinking before the procedure.  You will be able to see, hear, and talk to the person running the machine while you are in it. Follow that person's instructions. This information is not intended to replace advice given to you by your health care provider. Make sure you discuss any questions you have with your health care provider. Document Released: 12/19/2004 Document Revised: 12/14/2016 Document Reviewed: 12/14/2016 Elsevier Interactive Patient Education  2018 Elsevier Inc.  Cystoscopy Cystoscopy is a procedure that is used to help diagnose and sometimes treat conditions that affect that lower urinary tract. The lower urinary tract includes the bladder and the tube that drains urine from the bladder out of the body (urethra). Cystoscopy is performed with a thin, tube-shaped instrument with a light and camera at the end (cystoscope). The cystoscope may be hard (rigid) or flexible, depending on the goal of the procedure.The cystoscope is inserted through the urethra, into the bladder. Cystoscopy may be recommended if you have:  Urinary tractinfections that keep coming back (recurring).  Blood in the urine (hematuria).    Loss of bladder control (urinary incontinence) or an overactive bladder.  Unusual cells found in a urine sample.  A blockage in the urethra.  Painful urination.  An abnormality in the bladder found during an intravenous pyelogram (IVP) or CT scan.  Cystoscopy may also be done to remove a sample of tissue to be examined under a microscope (biopsy). Tell a health care provider about:  Any allergies you have.  All medicines you are taking, including vitamins, herbs, eye drops, creams, and over-the-counter medicines.  Any problems you or family members have had with anesthetic medicines.  Any blood disorders you have.  Any surgeries you have had.  Any medical conditions you have.  Whether you are pregnant or may be pregnant. What are the  risks? Generally, this is a safe procedure. However, problems may occur, including:  Infection.  Bleeding.  Allergic reactions to medicines.  Damage to other structures or organs.  What happens before the procedure?  Ask your health care provider about: ? Changing or stopping your regular medicines. This is especially important if you are taking diabetes medicines or blood thinners. ? Taking medicines such as aspirin and ibuprofen. These medicines can thin your blood. Do not take these medicines before your procedure if your health care provider instructs you not to.  Follow instructions from your health care provider about eating or drinking restrictions.  You may be given antibiotic medicine to help prevent infection.  You may have an exam or testing, such as X-rays of the bladder, urethra, or kidneys.  You may have urine tests to check for signs of infection.  Plan to have someone take you home after the procedure. What happens during the procedure?  To reduce your risk of infection,your health care team will wash or sanitize their hands.  You will be given one or more of the following: ? A medicine to help you relax (sedative). ? A medicine to numb the area (local anesthetic).  The area around the opening of your urethra will be cleaned.  The cystoscope will be passed through your urethra into your bladder.  Germ-free (sterile)fluid will flow through the cystoscope to fill your bladder. The fluid will stretch your bladder so that your surgeon can clearly examine your bladder walls.  The cystoscope will be removed and your bladder will be emptied. The procedure may vary among health care providers and hospitals. What happens after the procedure?  You may have some soreness or pain in your abdomen and urethra. Medicines will be available to help you.  You may have some blood in your urine.  Do not drive for 24 hours if you received a sedative. This information is  not intended to replace advice given to you by your health care provider. Make sure you discuss any questions you have with your health care provider. Document Released: 11/08/2000 Document Revised: 03/21/2016 Document Reviewed: 09/28/2015 Elsevier Interactive Patient Education  2018 Elsevier Inc.  

## 2018-05-14 LAB — MICROSCOPIC EXAMINATION: RBC, UA: NONE SEEN /hpf (ref 0–2)

## 2018-05-14 LAB — URINALYSIS, COMPLETE
Bilirubin, UA: NEGATIVE
Glucose, UA: NEGATIVE
Nitrite, UA: NEGATIVE
RBC, UA: NEGATIVE
Specific Gravity, UA: >1.03 — ABNORMAL HIGH (ref 1.005–1.030)
Urobilinogen, Ur: 0.2 mg/dL (ref 0.2–1.0)
pH, UA: 5 (ref 5.0–7.5)

## 2018-05-14 LAB — BUN+CREAT
BUN/Creatinine Ratio: 22 (ref 9–23)
BUN: 14 mg/dL (ref 6–20)
Creatinine, Ser: 0.65 mg/dL (ref 0.57–1.00)
GFR calc Af Amer: 132 mL/min/{1.73_m2} (ref >59)
GFR calc non Af Amer: 115 mL/min/{1.73_m2} (ref >59)

## 2018-05-14 LAB — HCG, SERUM, QUALITATIVE: hCG,Beta Subunit,Qual,Serum: NEGATIVE m[IU]/mL (ref <6)

## 2018-05-16 LAB — CULTURE, URINE COMPREHENSIVE

## 2018-06-10 ENCOUNTER — Encounter: Payer: Self-pay | Admitting: Radiology

## 2018-06-10 ENCOUNTER — Ambulatory Visit
Admission: RE | Admit: 2018-06-10 | Discharge: 2018-06-10 | Disposition: A | Discharge status: Home / Self Care | Payer: Medicaid Other | Source: Ambulatory Visit | Attending: Urology | Admitting: Urology

## 2018-06-10 DIAGNOSIS — N2889 Other specified disorders of kidney and ureter: Secondary | ICD-10-CM | POA: Insufficient documentation

## 2018-06-10 DIAGNOSIS — R310 Gross hematuria: Secondary | ICD-10-CM | POA: Diagnosis present

## 2018-06-10 DIAGNOSIS — N200 Calculus of kidney: Secondary | ICD-10-CM | POA: Insufficient documentation

## 2018-06-10 MED ORDER — IOPAMIDOL (ISOVUE-300) INJECTION 61%
150.0000 mL | Freq: Once | INTRAVENOUS | Status: AC | PRN
  Administered 2018-06-10: 150 mL via INTRAVENOUS

## 2018-06-17 ENCOUNTER — Other Ambulatory Visit: Payer: Self-pay

## 2018-06-17 ENCOUNTER — Emergency Department
Admission: EM | Admit: 2018-06-17 | Discharge: 2018-06-17 | Disposition: A | Discharge status: Home / Self Care | Payer: Medicaid Other | Attending: Emergency Medicine | Admitting: Emergency Medicine

## 2018-06-17 DIAGNOSIS — S20369A Insect bite (nonvenomous) of unspecified front wall of thorax, initial encounter: Secondary | ICD-10-CM | POA: Insufficient documentation

## 2018-06-17 DIAGNOSIS — S70361A Insect bite (nonvenomous), right thigh, initial encounter: Secondary | ICD-10-CM | POA: Diagnosis present

## 2018-06-17 DIAGNOSIS — Z79899 Other long term (current) drug therapy: Secondary | ICD-10-CM | POA: Diagnosis not present

## 2018-06-17 DIAGNOSIS — F17200 Nicotine dependence, unspecified, uncomplicated: Secondary | ICD-10-CM | POA: Insufficient documentation

## 2018-06-17 DIAGNOSIS — Y999 Unspecified external cause status: Secondary | ICD-10-CM | POA: Diagnosis not present

## 2018-06-17 DIAGNOSIS — F410 Panic disorder [episodic paroxysmal anxiety] without agoraphobia: Secondary | ICD-10-CM | POA: Diagnosis not present

## 2018-06-17 DIAGNOSIS — Y93E1 Activity, personal bathing and showering: Secondary | ICD-10-CM | POA: Diagnosis not present

## 2018-06-17 DIAGNOSIS — J449 Chronic obstructive pulmonary disease, unspecified: Secondary | ICD-10-CM | POA: Diagnosis not present

## 2018-06-17 DIAGNOSIS — R030 Elevated blood-pressure reading, without diagnosis of hypertension: Secondary | ICD-10-CM | POA: Diagnosis not present

## 2018-06-17 DIAGNOSIS — Y92002 Bathroom of unspecified non-institutional (private) residence single-family (private) house as the place of occurrence of the external cause: Secondary | ICD-10-CM | POA: Diagnosis not present

## 2018-06-17 DIAGNOSIS — W57XXXA Bitten or stung by nonvenomous insect and other nonvenomous arthropods, initial encounter: Secondary | ICD-10-CM | POA: Diagnosis not present

## 2018-06-17 DIAGNOSIS — Z91040 Latex allergy status: Secondary | ICD-10-CM | POA: Insufficient documentation

## 2018-06-17 MED ORDER — LORAZEPAM 2 MG/ML IJ SOLN
2.0000 mg | Freq: Once | INTRAMUSCULAR | Status: AC
  Administered 2018-06-17: 2 mg via INTRAMUSCULAR
  Filled 2018-06-17: qty 1

## 2018-06-17 MED ORDER — HYDROXYZINE HCL 50 MG PO TABS
50.0000 mg | ORAL_TABLET | Freq: Once | ORAL | Status: AC
  Administered 2018-06-17: 50 mg via ORAL
  Filled 2018-06-17: qty 1

## 2018-06-17 MED ORDER — HYDROXYZINE HCL 50 MG PO TABS: 50.0000 mg | ORAL_TABLET | Freq: Three times a day (TID) | ORAL | 0 refills | Status: DC | PRN

## 2018-06-17 NOTE — ED Notes (Signed)
Pt c/o having multiple bug bites all over and states she was concerned about bed bugs. No noted bugs visible. Pt states she went into a panic attack today and ran out of her xanax.

## 2018-06-17 NOTE — ED Triage Notes (Signed)
Pt states she was bitten by some kind of bug yesterday. States she has a picture. Pt states she is anxious from it. Alert, oriented, ambulatory. Points to R side of chest where bug bite is.

## 2018-06-17 NOTE — ED Provider Notes (Signed)
[facility] Emergency Department Provider Note   ____________________________________________   First MD Initiated Contact with Patient 06/17/18 1122     (approximate)  I have reviewed the triage vital signs and the nursing notes.   HISTORY  Chief Complaint Insect Bite    HPI Amanda [name] is a 37 y.o. female patient states she was bitten by bug yesterday.  Patient a bite occurred while taking a shower.  Patient also is having a panic attack because she is out of her Xanax.  Past Medical History:  Diagnosis Date  . Anxiety   . COPD (chronic obstructive pulmonary disease) (HCC)   . Fibromyalgia   . Renal disorder   . UTI (urinary tract infection)     Patient Active Problem List   Diagnosis Date Noted  . Attention deficit disorder of adult 01/01/2016  . Nephrolithiasis 01/30/2015  . Panic attack 05/30/2014  . Obesity (BMI 30-39.9) 03/28/2014  . Depression 12/21/2013  . Fibromyalgia 06/21/2013  . Hemorrhage of rectum and anus 01/05/2013  . Low back pain 12/22/2012  . 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 CESAREAN SECTION    . DENTAL SURGERY    . EYE SURGERY    . TUBAL LIGATION      Prior to Admission medications   Medication Sig Start Date End Date Taking? Authorizing Provider  albuterol (PROVENTIL HFA;VENTOLIN HFA) 108 (90 Base) MCG/ACT inhaler Inhale 2 puffs into the lungs every 6 (six) hours as needed for wheezing or shortness of breath.    [provider]  ALPRAZolam Prudy Feeler) 1 MG tablet Take 1 mg by mouth 3 (three) times daily as needed. For anxiety. 11/08/15   [provider]  amphetamine-dextroamphetamine (ADDERALL) 5 MG tablet Take 5 mg by mouth 2 (two) times daily. 11/08/15   [provider]  hydrOXYzine (ATARAX/VISTARIL) 50 MG tablet Take 1 tablet (50 mg total) by mouth 3 (three) times daily as needed for itching. 06/17/18   Joni Reining, PA-C    promethazine (PHENERGAN) 12.5 MG tablet Take 1 tablet (12.5 mg total) by mouth every 6 (six) hours as needed for nausea or vomiting. 02/04/18   Willy Eddy, MD    Allergies Latex  History reviewed. No pertinent family history.  Social History Social History   Tobacco Use  . Smoking status: Current Every Day Smoker  . Smokeless tobacco: Never Used  Substance Use Topics  . Alcohol use: Yes  . Drug use: Not on file    Review of Systems Constitutional: No fever/chills.  Eyes: No visual changes. ENT: No sore throat. Cardiovascular: Denies chest pain. Respiratory: Denies shortness of breath. Gastrointestinal: No abdominal pain.  No nausea, no vomiting.  No diarrhea.  No constipation. Genitourinary: Negative for dysuria. Musculoskeletal: Negative for back pain. Skin: Negative for rash. Neurological: Negative for headaches, focal weakness or numbness. Psychiatric:ADD, anxiety, and depression. Hematological/Lymphatic: Allergic/Immunilogical: Latex. ____________________________________________   PHYSICAL EXAM:  VITAL SIGNS: ED Triage Vitals  Enc Vitals Group     BP 06/17/18 1113 (!) 153/116     Pulse Rate 06/17/18 1113 98     Resp 06/17/18 1113 18     Temp 06/17/18 1113 98.3 F (36.8 C)     Temp Source 06/17/18 1113 Oral     SpO2 06/17/18 1113 98 %     Weight 06/17/18 1115 165 lb (74.8 kg)     Height 06/17/18 1115 5\' 3"  (1.6 m)     Head  Circumference --      Peak Flow --      Pain Score 06/17/18 1115 2     Pain Loc --      Pain Edu? --      Excl. in GC? --    Constitutional: Alert and oriented. Well appearing and in no acute distress.  Anxious. Cardiovascular: Normal rate, regular rhythm. Grossly normal heart sounds.  Good peripheral circulation.  Elevated blood pressure Respiratory: Normal respiratory effort.  No retractions. Lungs CTAB. Neurologic:  Normal speech and language. No gross focal neurologic deficits are appreciated. No gait instability. Skin:   Skin is warm, dry and intact.  Erythematous macular lesions on chest and right thigh.   Psychiatric: Hyperactive and anxious. ____________________________________________   LABS (all labs ordered are listed, but only abnormal results are displayed)  Labs Reviewed - No data to display ____________________________________________  EKG   ____________________________________________  RADIOLOGY  ED MD interpretation:    Official radiology report(s): No results found.  ____________________________________________   PROCEDURES  Procedure(s) performed: None  Procedures  Critical Care performed: No  ____________________________________________   INITIAL IMPRESSION / ASSESSMENT AND PLAN / ED COURSE  As part of my medical decision making, I reviewed the following data within the electronic MEDICAL RECORD NUMBER    Localized reaction to insect bites with anxiety.  Patient reassured no acute process is occurring.  Patient given Ativan and Atarax.  Patient given discharge care instruction and prescription for Atarax.  Patient advised to follow-up with treating doctor for refill of her Xanax.      ____________________________________________   FINAL CLINICAL IMPRESSION(S) / ED DIAGNOSES  Final diagnoses:  Bug bite, initial encounter  Anxiety attack     ED Discharge Orders        Ordered    hydrOXYzine (ATARAX/VISTARIL) 50 MG tablet  3 times daily PRN     06/17/18 1155       Note:  This document was prepared using Dragon voice recognition software and may include unintentional dictation errors.    Joni ReiningSmith, Ronald K, PA-C 06/17/18 1202    Minna AntisPaduchowski, Kevin, MD 06/17/18 [phone]

## 2018-06-17 NOTE — Discharge Instructions (Addendum)
Pick up prescription at your pharmacy

## 2018-06-22 ENCOUNTER — Ambulatory Visit (INDEPENDENT_AMBULATORY_CARE_PROVIDER_SITE_OTHER): Payer: Medicaid Other | Admitting: Urology

## 2018-06-22 ENCOUNTER — Encounter: Payer: Self-pay | Admitting: Urology

## 2018-06-22 VITALS — BP 101/68 | HR 108 | Ht 63.0 in | Wt 159.8 lb

## 2018-06-22 DIAGNOSIS — R310 Gross hematuria: Secondary | ICD-10-CM

## 2018-06-22 DIAGNOSIS — N2889 Other specified disorders of kidney and ureter: Secondary | ICD-10-CM | POA: Diagnosis not present

## 2018-06-22 DIAGNOSIS — N200 Calculus of kidney: Secondary | ICD-10-CM | POA: Diagnosis not present

## 2018-06-22 LAB — URINALYSIS, COMPLETE
Bilirubin, UA: NEGATIVE
Glucose, UA: NEGATIVE
Ketones, UA: NEGATIVE
Nitrite, UA: POSITIVE — AB
Specific Gravity, UA: 1.02 (ref 1.005–1.030)
Urobilinogen, Ur: 1 mg/dL (ref 0.2–1.0)
pH, UA: 6.5 (ref 5.0–7.5)

## 2018-06-22 LAB — MICROSCOPIC EXAMINATION

## 2018-06-22 MED ORDER — DIAZEPAM 10 MG PO TABS: ORAL_TABLET | ORAL | 0 refills | Status: DC

## 2018-06-22 MED ORDER — FLUCONAZOLE 150 MG PO TABS: 150.0000 mg | ORAL_TABLET | Freq: Once | ORAL | 0 refills | Status: AC

## 2018-06-22 MED ORDER — CIPROFLOXACIN HCL 500 MG PO TABS: 500.0000 mg | ORAL_TABLET | Freq: Once | ORAL | Status: DC

## 2018-06-22 MED ORDER — HYDROXYZINE HCL 50 MG PO TABS: 50.0000 mg | ORAL_TABLET | Freq: Three times a day (TID) | ORAL | 0 refills | Status: DC | PRN

## 2018-06-22 MED ORDER — CIPROFLOXACIN HCL 250 MG PO TABS: 250.0000 mg | ORAL_TABLET | Freq: Two times a day (BID) | ORAL | 0 refills | Status: DC

## 2018-06-22 MED ORDER — LIDOCAINE HCL URETHRAL/MUCOSAL 2 % EX GEL: 1.0000 "application " | Freq: Once | CUTANEOUS | Status: DC

## 2018-06-22 NOTE — Progress Notes (Signed)
06/22/2018 12:15 PM   Amanda RueKristina L Dolezal 12-Mar-1981 [id number]  Referring provider: Delton Prairieobin, Paul, MD [address] [facility] [place], [place] [id number]  Chief Complaint  Patient presents with  . Cysto    HPI: Patient is a 37 year old Caucasian female who presents for CTU report and cystoscopy, but she was found to have an UTI and so cystoscopy was postponed.    Most [facility] and noted bilateral nephrolithiasis.  She has not seen an urologist in the past.  Stone composition is unknown.    Her CTU on 06/11/2018 noted normal adrenals. Nonobstructing 10 mm upper right renal stone. No additional renal stones. No hydronephrosis.  Normal caliber ureters, with no ureteral stones. Hypodense 1.1 cm interpolar right kidney (series 8/image 36) and hypodense 1.5 cm lower left kidney (series 8/image 38) lesions. Several subcentimeter hypodense renal cortical lesions scattered in both kidneys, too small to characterize, which require no follow-up. On delayed imaging, there is no urothelial wall thickening and there are no filling defects in the opacified portions of the bilateral collecting systems or ureters. No bladder stones, masses, wall thickening or diverticula.  She is still having frequency, urgency, dysuria, nocturia, intermittency, hesitancy, straining to urine and gross hematuria.  This has been going on for years.  Patient denies any dysuria or suprapubic/flank pain.  Patient denies any fevers, chills, nausea or vomiting.   She has bad constipation.  She has not seen an urologist in the past.      Her UA today is positive for nitrite, 6-10 WBC's and many bacteria.  Cystoscopy was cancelled.    PMH: Past Medical History:  Diagnosis Date  . Anxiety   . COPD (chronic obstructive pulmonary disease) (HCC)   . Fibromyalgia   . Renal disorder   . UTI (urinary tract infection)     Surgical History: Past Surgical History:    Procedure Laterality Date  . CESAREAN SECTION    . DENTAL SURGERY    . EYE SURGERY    . TUBAL LIGATION      Home Medications:  Allergies as of 06/22/2018      Reactions   Latex Itching, Swelling      Medication List        Accurate as of 06/22/18 11:59 PM. Always use your most recent med list.          albuterol 108 (90 Base) MCG/ACT inhaler Commonly known as:  PROVENTIL HFA;VENTOLIN HFA Inhale 2 puffs into the lungs every 6 (six) hours as needed for wheezing or shortness of breath.   ALPRAZolam 1 MG tablet Commonly known as:  XANAX Take 1 mg by mouth 3 (three) times daily as needed. For anxiety.   amphetamine-dextroamphetamine 5 MG tablet Commonly known as:  ADDERALL Take 5 mg by mouth 2 (two) times daily.   ciprofloxacin 250 MG tablet Commonly known as:  CIPRO Take 1 tablet (250 mg total) by mouth 2 (two) times daily.   diazepam 10 MG tablet Commonly known as:  VALIUM Take one tablet 30 minutes prior to MRI   fluconazole 150 MG tablet Commonly known as:  DIFLUCAN Take 1 tablet (150 mg total) by mouth once for 1 dose.   hydrOXYzine 50 MG tablet Commonly known as:  ATARAX/VISTARIL Take 1 tablet (50 mg total) by mouth 3 (three) times daily as needed for itching.   promethazine 12.5 MG tablet Commonly known as:  PHENERGAN Take 1 tablet (12.5 mg  total) by mouth every 6 (six) hours as needed for nausea or vomiting.       Allergies:  Allergies  Allergen Reactions  . Latex Itching and Swelling    Family History: History reviewed. No pertinent family history.  Social History:  reports that she has been smoking.  She has never used smokeless tobacco. She reports that she drinks alcohol. Her drug history is not on file.  ROS: UROLOGY Frequent Urination?: Yes Hard to postpone urination?: Yes Burning/pain with urination?: Yes Get up at night to urinate?: Yes Leakage of urine?: No Urine stream starts and stops?: Yes Trouble starting stream?: Yes Do you  have to strain to urinate?: Yes Blood in urine?: Yes Urinary tract infection?: Yes Sexually transmitted disease?: No Injury to kidneys or bladder?: No Painful intercourse?: Yes Weak stream?: No Currently pregnant?: No Vaginal bleeding?: No Last menstrual period?: n  Gastrointestinal Nausea?: No Vomiting?: No Indigestion/heartburn?: Yes Diarrhea?: No Constipation?: No  Constitutional Fever: No Night sweats?: No Weight loss?: No Fatigue?: No  Skin Skin rash/lesions?: No Itching?: No  Eyes Blurred vision?: No Double vision?: No  Ears/Nose/Throat Sore throat?: No Sinus problems?: No  Hematologic/Lymphatic Swollen glands?: No Easy bruising?: Yes  Cardiovascular Leg swelling?: Yes Chest pain?: Yes  Respiratory Cough?: No Shortness of breath?: No  Endocrine Excessive thirst?: No  Musculoskeletal Back pain?: Yes Joint pain?: Yes  Neurological Headaches?: Yes Dizziness?: Yes  Psychologic Depression?: Yes Anxiety?: Yes  Physical Exam: BP 101/68 (BP Location: Left Arm, Patient Position: Sitting, Cuff Size: Normal)   Pulse (!) 108   Ht 5\' 3"  (1.6 m)   Wt 159 lb 12.8 oz (72.5 kg)   BMI 28.31 kg/m   Constitutional:  Well nourished. Alert and oriented, No acute distress. HEENT: [place] AT, moist mucus membranes.  Trachea midline, no masses. Cardiovascular: No clubbing, cyanosis, or edema. Respiratory: Normal respiratory effort, no increased work of breathing. Lymph: No cervical or inguinal adenopathy. Neurologic: Grossly intact, no focal deficits, moving all 4 extremities. Psychiatric: Normal mood and affect.  Laboratory Data: Lab Results  Component Value Date   WBC 11.0 02/04/2018   HGB 12.8 02/04/2018   HCT 38.3 02/04/2018   MCV 94.3 02/04/2018   PLT 285 02/04/2018    Lab Results  Component Value Date   CREATININE 0.65 05/13/2018    No results found for: PSA  No results found for: TESTOSTERONE  No results found for: HGBA1C  No results  found for: TSH  No results found for: CHOL, HDL, CHOLHDL, VLDL, LDLCALC  Lab Results  Component Value Date   AST 24 03/19/2015   Lab Results  Component Value Date   ALT 17 03/19/2015   No components found for: ALKALINEPHOPHATASE No components found for: BILIRUBINTOTAL  No results found for: ESTRADIOL  Urinalysis Nitrite positive.  6-10 WBC's.  Many bacturia.   See Epic.    I have reviewed the labs.  Pertinent Imaging CLINICAL DATA:  Gross hematuria. History of nephrolithiasis and urinary tract infection.  EXAM: CT ABDOMEN AND PELVIS WITHOUT AND WITH CONTRAST  TECHNIQUE: Multidetector CT imaging of the abdomen and pelvis was performed following the standard protocol before and following the bolus administration of intravenous contrast.  CONTRAST:  ISOVUE-300 IOPAMIDOL (ISOVUE-300) INJECTION 61%  COMPARISON:  03/19/2015 CT abdomen/pelvis.  FINDINGS: Lower chest: No significant pulmonary nodules or acute consolidative airspace disease.  Hepatobiliary: Normal liver size. No liver mass. Normal gallbladder with no radiopaque cholelithiasis. No biliary ductal dilatation.  Pancreas: Normal, with no mass or  duct dilation.  Spleen: Normal size. No mass.  Adrenals/Urinary Tract: Normal adrenals. Nonobstructing 10 mm upper right renal stone. No additional renal stones. No hydronephrosis. Normal caliber ureters, with no ureteral stones. Hypodense 1.1 cm interpolar right kidney (series 8/image 36) and hypodense 1.5 cm lower left kidney (series 8/image 38) lesions. Several subcentimeter hypodense renal cortical lesions scattered in both kidneys, too small to characterize, which require no follow-up. On delayed imaging, there is no urothelial wall thickening and there are no filling defects in the opacified portions of the bilateral collecting systems or ureters. No bladder stones, masses, wall thickening or diverticula.  Stomach/Bowel: Normal  non-distended stomach. Normal caliber small bowel with no small bowel wall thickening. Appendix is dilated (11 mm diameter) and otherwise normal. Normal large bowel with no diverticulosis, large bowel wall thickening or pericolonic fat stranding.  Vascular/Lymphatic: Normal caliber abdominal aorta. Patent portal, splenic, hepatic and renal veins. No pathologically enlarged lymph nodes in the abdomen or pelvis.  Reproductive: Grossly normal uterus. Simple 1.3 cm right adnexal cyst, for which no follow-up is required. No left adnexal mass.  Other: No pneumoperitoneum, ascites or focal fluid collection.  Musculoskeletal: No aggressive appearing focal osseous lesions.  IMPRESSION: 1. Solitary nonobstructing 10 mm upper right renal stone. No hydronephrosis. No additional urolithiasis. 2. Two small indeterminate hypodense renal cortical lesions, largest 1.5 cm in the lower left kidney. MRI abdomen without and with IV contrast is indicated to exclude renal cell carcinoma, although benign renal cysts are more likely. 3. No evidence of urothelial lesions.   Electronically Signed   By: Delbert Phenix M.D.   On: 06/11/2018 08:21  I have independently reviewed the films  Assessment & Plan:    1. Gross hematuria CTU completed demonstrated 10 mm right renal stone and 2 indeterminate lesions for which MRI is recommended Obtain MRI of the kidney RTC for MRI report and cystoscopy with Dr. Apolinar Junes  2. Right renal stone Patient would like definitive treatment of the stone in the future pending MRI results  Return for MRI report and cystoscopy with Dr. Apolinar Junes .  These notes generated with voice recognition software. I apologize for typographical errors.  Michiel Cowboy, PA-C  [facility] [address]  [address] [place], [place] [address] [phone]

## 2018-06-25 ENCOUNTER — Telehealth: Payer: Self-pay | Admitting: Family Medicine

## 2018-06-25 LAB — CULTURE, URINE COMPREHENSIVE

## 2018-06-25 NOTE — Telephone Encounter (Signed)
-----   Message from Harle BattiestShannon A McGowan, PA-C sent at 06/25/2018 12:33 PM EDT ----- Please let Mrs. Laural BenesJohnson know that her urine culture was positive for infection.  The Cipro is the appropriate antibiotic.

## 2018-06-25 NOTE — Telephone Encounter (Signed)
Patient notified

## 2018-06-26 ENCOUNTER — Other Ambulatory Visit: Payer: Self-pay | Admitting: Urology

## 2018-07-03 ENCOUNTER — Telehealth: Payer: Self-pay | Admitting: Urology

## 2018-07-03 NOTE — Telephone Encounter (Signed)
Pt lmom stating that she has finisher her antibiotics, still having UTI symptoms. Pt is asking for more antibiotics. Please advise. Thanks.

## 2018-07-05 ENCOUNTER — Encounter: Payer: Self-pay | Admitting: Emergency Medicine

## 2018-07-05 ENCOUNTER — Ambulatory Visit
Admission: RE | Admit: 2018-07-05 | Discharge: 2018-07-05 | Disposition: A | Discharge status: Home / Self Care | Payer: Medicaid Other | Source: Ambulatory Visit | Attending: Emergency Medicine | Admitting: Emergency Medicine

## 2018-07-05 ENCOUNTER — Ambulatory Visit
Admission: EM | Admit: 2018-07-05 | Discharge: 2018-07-05 | Disposition: A | Discharge status: Home / Self Care | Payer: Medicaid Other | Attending: Family Medicine | Admitting: Family Medicine

## 2018-07-05 ENCOUNTER — Encounter: Payer: Self-pay | Admitting: Gynecology

## 2018-07-05 DIAGNOSIS — N200 Calculus of kidney: Secondary | ICD-10-CM | POA: Diagnosis not present

## 2018-07-05 DIAGNOSIS — Z79899 Other long term (current) drug therapy: Secondary | ICD-10-CM | POA: Insufficient documentation

## 2018-07-05 DIAGNOSIS — E669 Obesity, unspecified: Secondary | ICD-10-CM | POA: Diagnosis not present

## 2018-07-05 DIAGNOSIS — R300 Dysuria: Secondary | ICD-10-CM

## 2018-07-05 DIAGNOSIS — F17200 Nicotine dependence, unspecified, uncomplicated: Secondary | ICD-10-CM | POA: Diagnosis not present

## 2018-07-05 DIAGNOSIS — R109 Unspecified abdominal pain: Secondary | ICD-10-CM | POA: Diagnosis not present

## 2018-07-05 DIAGNOSIS — Z7982 Long term (current) use of aspirin: Secondary | ICD-10-CM | POA: Diagnosis not present

## 2018-07-05 DIAGNOSIS — R319 Hematuria, unspecified: Secondary | ICD-10-CM | POA: Insufficient documentation

## 2018-07-05 DIAGNOSIS — M797 Fibromyalgia: Secondary | ICD-10-CM | POA: Diagnosis not present

## 2018-07-05 DIAGNOSIS — J449 Chronic obstructive pulmonary disease, unspecified: Secondary | ICD-10-CM | POA: Diagnosis not present

## 2018-07-05 DIAGNOSIS — Z87440 Personal history of urinary (tract) infections: Secondary | ICD-10-CM | POA: Diagnosis not present

## 2018-07-05 DIAGNOSIS — N281 Cyst of kidney, acquired: Secondary | ICD-10-CM | POA: Insufficient documentation

## 2018-07-05 DIAGNOSIS — Z87442 Personal history of urinary calculi: Secondary | ICD-10-CM | POA: Diagnosis not present

## 2018-07-05 HISTORY — DX: Calculus of kidney: N20.0

## 2018-07-05 LAB — URINALYSIS, COMPLETE (UACMP) WITH MICROSCOPIC: Bacteria, UA: NONE SEEN

## 2018-07-05 LAB — CBC WITH DIFFERENTIAL/PLATELET
Basophils Absolute: 0.1 10*3/uL (ref 0–0.1)
Basophils Relative: 1 %
Eosinophils Absolute: 0.2 10*3/uL (ref 0–0.7)
Eosinophils Relative: 3 %
HCT: 36.6 % (ref 35.0–47.0)
Hemoglobin: 12.4 g/dL (ref 12.0–16.0)
Lymphocytes Relative: 36 %
Lymphs Abs: 3.2 10*3/uL (ref 1.0–3.6)
MCH: 31.6 pg (ref 26.0–34.0)
MCHC: 34 g/dL (ref 32.0–36.0)
MCV: 93.1 fL (ref 80.0–100.0)
Monocytes Absolute: 0.6 10*3/uL (ref 0.2–0.9)
Monocytes Relative: 7 %
Neutro Abs: 4.7 10*3/uL (ref 1.4–6.5)
Neutrophils Relative %: 53 %
Platelets: 347 10*3/uL (ref 150–440)
RBC: 3.93 MIL/uL (ref 3.80–5.20)
RDW: 14 % (ref 11.5–14.5)
WBC: 8.8 10*3/uL (ref 3.6–11.0)

## 2018-07-05 LAB — BASIC METABOLIC PANEL
Anion gap: 10 (ref 5–15)
BUN: 15 mg/dL (ref 6–20)
CO2: 24 mmol/L (ref 22–32)
Calcium: 9 mg/dL (ref 8.9–10.3)
Chloride: 108 mmol/L (ref 98–111)
Creatinine, Ser: 0.79 mg/dL (ref 0.44–1.00)
GFR calc Af Amer: >60 mL/min (ref >60)
GFR calc non Af Amer: >60 mL/min (ref >60)
Glucose, Bld: 86 mg/dL (ref 70–99)
Potassium: 3.6 mmol/L (ref 3.5–5.1)
Sodium: 142 mmol/L (ref 135–145)

## 2018-07-05 MED ORDER — ACETAMINOPHEN 325 MG PO TABS
650.0000 mg | ORAL_TABLET | Freq: Once | ORAL | Status: AC
  Administered 2018-07-05: 650 mg via ORAL

## 2018-07-05 MED ORDER — OXYCODONE-ACETAMINOPHEN 5-325 MG PO TABS: 1.0000 | ORAL_TABLET | Freq: Four times a day (QID) | ORAL | 0 refills | Status: DC | PRN

## 2018-07-05 NOTE — ED Provider Notes (Addendum)
[facility] ____________________________________________  Time seen: Approximately 2:30 PM  I have reviewed the triage vital signs and the nursing notes.   HISTORY  Chief Complaint Hematuria   HPI JAGGER BEAHM is a 37 y.o. female past medical history of recurrent UTIs, kidney stones, anxiety presenting for evaluation of dysuria, hematuria and bilateral back pain.  States this is been present for the last 2 days.  States completed Cipro 2 days ago for which she was treated for UTI after seeing urology.  States that she was seen by urology and had a CT with and without contrast of abdominal that showed kidney stone and they plan for a cystoscopy but had to cancel the procedure and reschedule due to urinary tract infection.  Patient states since yesterday she is having increasing pain, blood in her urine and discomfort with urination.  Patient denies any anterior abdominal pain. Started noticing blood in urine yesterday.No vaginal bleeding. States last night she had a 104 fever, but reports she was just under a lot of blankets and unsure if this was the cause.  Has been taken Tylenol intermittently for the last 2 days for the pain.  Last took Tylenol 4 hours ago. Also took otc azo which helped some. Has continued to eat and drink overall well, some intermittent nausea, no vomiting.  Last bowel movement earlier today.  States last night she felt that she passed out due to the pain when she was laying on her bed.  No fall or head injury.  States pain also unrelieved with soaking in bathtub.  Denies other aggravating alleviating factors.  No trauma.  Denies chance of pregnancy.  Denies vaginal discharge, vaginal pain or vaginal complaints, denies concerns of STDs.  Emogene Morgan, MD: PCP Urology: [facility]  Patient's last menstrual period was 07/02/2018.Denies pregnancy.     Past Medical History:  Diagnosis Date  . Anxiety   . COPD (chronic obstructive pulmonary disease) (HCC)    . Fibromyalgia   . Kidney stones   . Renal disorder   . UTI (urinary tract infection)     Patient Active Problem List   Diagnosis Date Noted  . Attention deficit disorder of adult 01/01/2016  . Nephrolithiasis 01/30/2015  . Panic attack 05/30/2014  . Obesity (BMI 30-39.9) 03/28/2014  . Depression 12/21/2013  . Fibromyalgia 06/21/2013  . Hemorrhage of rectum and anus 01/05/2013  . Low back pain 12/22/2012  . 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 CESAREAN SECTION    . DENTAL SURGERY    . EYE SURGERY    . TUBAL LIGATION       No current facility-administered medications for this encounter.   Current Outpatient Medications:  .  albuterol (PROVENTIL HFA;VENTOLIN HFA) 108 (90 Base) MCG/ACT inhaler, Inhale 2 puffs into the lungs every 6 (six) hours as needed for wheezing or shortness of breath., Disp: , Rfl:  .  ALPRAZolam (XANAX) 1 MG tablet, Take 1 mg by mouth 3 (three) times daily as needed. For anxiety., Disp: , Rfl:  .  amphetamine-dextroamphetamine (ADDERALL) 5 MG tablet, Take 5 mg by mouth 2 (two) times daily., Disp: , Rfl:  .  promethazine (PHENERGAN) 12.5 MG tablet, Take 1 tablet (12.5 mg total) by mouth every 6 (six) hours as needed for nausea or vomiting., Disp: 12 tablet, Rfl: 0 .  aspirin-acetaminophen-caffeine (EXCEDRIN MIGRAINE) 250-250-65 MG tablet, Take by mouth., Disp: , Rfl:  .  ciprofloxacin (CIPRO) 250 MG tablet, Take  1 tablet (250 mg total) by mouth 2 (two) times daily., Disp: 14 tablet, Rfl: 0 .  diazepam (VALIUM) 10 MG tablet, Take one tablet 30 minutes prior to MRI, Disp: 1 tablet, Rfl: 0 .  hydrOXYzine (ATARAX/VISTARIL) 50 MG tablet, TAKE 1 TABLET (50 MG TOTAL) BY MOUTH 3 (THREE) TIMES DAILY AS NEEDED FOR ITCHING., Disp: 15 tablet, Rfl: 0 .  Melatonin 1 MG TABS, Take by mouth., Disp: , Rfl:  .  oxyCODONE-acetaminophen (PERCOCET/ROXICET) 5-325 MG tablet, Take 1 tablet by mouth every 6 (six) hours as needed for  severe pain. Do not drive while taking as can cause drowsiness., Disp: 3 tablet, Rfl: 0  Allergies Latex  Family History  Problem Relation Age of Onset  . Fibroids Mother   . Kidney Stones Other   . Crohn's disease Sister     Social History Social History   Tobacco Use  . Smoking status: Current Every Day Smoker  . Smokeless tobacco: Never Used  Substance Use Topics  . Alcohol use: Yes  . Drug use: Never    Review of Systems Constitutional: As above.  Cardiovascular: Denies chest pain. Respiratory: Denies shortness of breath. Gastrointestinal:As above. No diarrhea.  No constipation. Genitourinary: positive for dysuria. Musculoskeletal: positive for back pain. Skin: Negative for rash. Neurological: Negative for headaches, focal weakness or numbness.   ____________________________________________   PHYSICAL EXAM:  VITAL SIGNS: ED Triage Vitals  Enc Vitals Group     BP 07/05/18 1338 138/87     Pulse Rate 07/05/18 1338 95     Resp 07/05/18 1338 16     Temp 07/05/18 1338 98 F (36.7 C)     Temp Source 07/05/18 1338 Oral     SpO2 07/05/18 1338 99 %     Weight 07/05/18 1349 155 lb (70.3 kg)     Height --      Head Circumference --      Peak Flow --      Pain Score 07/05/18 1349 10     Pain Loc --      Pain Edu? --      Excl. in GC? --     Constitutional: Alert and oriented. Appears uncomfortable. ENT      Head: Normocephalic and atraumatic. Cardiovascular: Normal rate, regular rhythm. Grossly normal heart sounds.  Good peripheral circulation. Respiratory: Normal respiratory effort without tachypnea nor retractions. Breath sounds are clear and equal bilaterally. No wheezes, rales, rhonchi. Gastrointestinal: Normal Bowel sounds. Bilateral CVA tenderness, left greater than right. Abdomen soft and nontender.  Musculoskeletal:  No midline cervical, thoracic or lumbar tenderness to palpation. Left lower paralumbar tenderness.  Neurologic:  Normal speech and  language. Speech is normal. No gait instability.  Skin:  Skin is warm, dry and intact. No rash noted. Psychiatric: Mood and affect are normal. Speech and behavior are normal. Patient exhibits appropriate insight and judgment   ___________________________________________   LABS (all labs ordered are listed, but only abnormal results are displayed)  Labs Reviewed  URINALYSIS, COMPLETE (UACMP) WITH MICROSCOPIC - Abnormal; Notable for the following components:      Result Value   Color, Urine RED (*)    APPearance HAZY (*)    Glucose, UA   (*)    Value: TEST NOT REPORTED DUE TO COLOR INTERFERENCE OF URINE PIGMENT   Hgb urine dipstick   (*)    Value: TEST NOT REPORTED DUE TO COLOR INTERFERENCE OF URINE PIGMENT   Bilirubin Urine   (*)    Value: TEST  NOT REPORTED DUE TO COLOR INTERFERENCE OF URINE PIGMENT   Ketones, ur   (*)    Value: TEST NOT REPORTED DUE TO COLOR INTERFERENCE OF URINE PIGMENT   Protein, ur   (*)    Value: TEST NOT REPORTED DUE TO COLOR INTERFERENCE OF URINE PIGMENT   Nitrite   (*)    Value: TEST NOT REPORTED DUE TO COLOR INTERFERENCE OF URINE PIGMENT   Leukocytes, UA   (*)    Value: TEST NOT REPORTED DUE TO COLOR INTERFERENCE OF URINE PIGMENT   All other components within normal limits  URINE CULTURE  CBC WITH DIFFERENTIAL/PLATELET  BASIC METABOLIC PANEL   ____________________________________________  RADIOLOGY  [place] Renal  Result Date: 07/05/2018 CLINICAL DATA:  Bilateral flank pain.  Hematuria. EXAM: RENAL / URINARY TRACT ULTRASOUND COMPLETE COMPARISON:  CT scan June 10, 2018 FINDINGS: Right Kidney: Length: 12 cm. Contains an 11 mm superior pole stone without hydronephrosis. Contains 2 cysts measuring 11 and 8 mm. Left Kidney: Length: 12 cm.  Contains a 14 mm complicated cystic mass. Bladder: Appears normal for degree of bladder distention. IMPRESSION: 1. Nonobstructive stone in the upper right kidney. 2. There is a complicated cystic mass in the lower pole of the  left kidney. An MRI of the abdomen without and with contrast was recommended for this lesion on the CT scan from June 10, 2018. Recommend MRI if not previously performed. 3. Right renal cyst. Electronically Signed   By: Gerome Samavid  Williams III M.D   On: 07/05/2018 16:53    EXAM: CT ABDOMEN AND PELVIS WITHOUT AND WITH CONTRAST  TECHNIQUE: Multidetector CT imaging of the abdomen and pelvis was performed following the standard protocol before and following the bolus administration of intravenous contrast.  CONTRAST:  150mL ISOVUE-300 IOPAMIDOL (ISOVUE-300) INJECTION 61%  COMPARISON:  03/19/2015 CT abdomen/pelvis.  FINDINGS: Lower chest: No significant pulmonary nodules or acute consolidative airspace disease.  Hepatobiliary: Normal liver size. No liver mass. Normal gallbladder with no radiopaque cholelithiasis. No biliary ductal dilatation.  Pancreas: Normal, with no mass or duct dilation.  Spleen: Normal size. No mass.  Adrenals/Urinary Tract: Normal adrenals. Nonobstructing 10 mm upper right renal stone. No additional renal stones. No hydronephrosis. Normal caliber ureters, with no ureteral stones. Hypodense 1.1 cm interpolar right kidney (series 8/image 36) and hypodense 1.5 cm lower left kidney (series 8/image 38) lesions. Several subcentimeter hypodense renal cortical lesions scattered in both kidneys, too small to characterize, which require no follow-up. On delayed imaging, there is no urothelial wall thickening and there are no filling defects in the opacified portions of the bilateral collecting systems or ureters. No bladder stones, masses, wall thickening or diverticula.  Stomach/Bowel: Normal non-distended stomach. Normal caliber small bowel with no small bowel wall thickening. Appendix is dilated (11 mm diameter) and otherwise normal. Normal large bowel with no diverticulosis, large bowel wall thickening or pericolonic fat stranding.  Vascular/Lymphatic:  Normal caliber abdominal aorta. Patent portal, splenic, hepatic and renal veins. No pathologically enlarged lymph nodes in the abdomen or pelvis.  Reproductive: Grossly normal uterus. Simple 1.3 cm right adnexal cyst, for which no follow-up is required. No left adnexal mass.  Other: No pneumoperitoneum, ascites or focal fluid collection.  Musculoskeletal: No aggressive appearing focal osseous lesions.  IMPRESSION: 1. Solitary nonobstructing 10 mm upper right renal stone. No hydronephrosis. No additional urolithiasis. 2. Two small indeterminate hypodense renal cortical lesions, largest 1.5 cm in the lower left kidney. MRI abdomen without and with IV contrast is indicated to exclude renal  cell carcinoma, although benign renal cysts are more likely. 3. No evidence of urothelial lesions.   Electronically Signed   By: Delbert Phenix M.D.   On: 06/11/2018 08:21  PROCEDURES Procedures    INITIAL IMPRESSION / ASSESSMENT AND PLAN / ED COURSE  Pertinent labs & imaging results that were available during my care of the patient were reviewed by me and considered in my medical decision making (see chart for details).  Patient appears uncomfortable.  Mother at bedside.  Patient with recurrent UTIs as well as history of kidney stones recently evaluated by urology.  Noted on above CT report 10 mm right kidney stone, as well as patient was noted to have a UTI which was trip treated with Cipro.  Urinalysis today interfered with patient having taken over-the-counter Azo prior to arrival, however no RBCs noted negative for bacteria and positive for crystals.  We will culture urine.  However discussed no clear indication for antibiotic use at this time.  In reviewing patient chart, patient has had multiple CTs of abdomen, and counseled regarding this in regards to radiation exposure.  As with the flank pain will evaluate ultrasound renal outpatient basis.  Recommend strong close follow-up with  urology tomorrow.  For inability to tolerate food and fluids, increased pain or worsening concerns, proceed directly to the ER.  CBC and BMP today within normal parameters.  Patient discharged to ultrasound outpatient.  1700: Ultrasound result reviewed from radiologist as above.  11 mm stone present in upper right kidney, no hydronephrosis.  Radiologist further mentions complicated cystic mass in the lower pole of the left kidney and further recommends MRI.  Discussed this in detail with patient.  There was also a similar recommendation from radiologist in July from the CT.  Counseled this with patient it appears in the computer that an MRI has been ordered, unsure if or when.  Patient further directed to follow-up closely with urology tomorrow.  Quantity 3 Percocet sent to patient pharmacy.  Discussed strict follow-up and return parameters including proceeding directly to the emergency room for any worsening complaints or pain.Discussed indication, risks and benefits of medications with patient and Mother.   Discussed follow up with Primary care physician this week. Discussed follow up and return parameters including no resolution or any worsening concerns. Patient verbalized understanding and agreed to plan.   Chronic controlled substance database reviewed, only recent documented substances patient chronic Adderall and Xanax. ____________________________________________   FINAL CLINICAL IMPRESSION(S) / ED DIAGNOSES  Final diagnoses:  Dysuria  Hematuria, unspecified type  Flank pain     ED Discharge Orders         Ordered    US RENAL     07/05/18 1434    oxyCODONE-acetaminophen (PERCOCET/ROXICET) 5-325 MG tablet  Every 6 hours PRN     07/05/18 1644           Note: This dictation was prepared with Dragon dictation along with smaller phrase technology. Any transcriptional errors that result from this process are unintentional.        Renford Dills, NP 07/07/18 [phone]

## 2018-07-05 NOTE — Discharge Instructions (Addendum)
Take medication as prescribed. Rest. Drink plenty of fluids.   Go directly to hospital now for ultrasound.  Follow up with urology tomorrow.   Follow up with your primary care physician this week as needed. Return to Urgent care or Emergency room for new or worsening concerns.

## 2018-07-05 NOTE — ED Triage Notes (Signed)
Patient c/o treated for UTI / kidney stones. Per patient was given antibiotic Cipro. Per patient finish antibiotic x 2 days. Per patient have been passing kidney stones x 2 days. Per patient had ct scan done. Pt. Stated blood in urine x 2 days.

## 2018-07-07 ENCOUNTER — Ambulatory Visit (INDEPENDENT_AMBULATORY_CARE_PROVIDER_SITE_OTHER): Payer: Medicaid Other | Admitting: Urology

## 2018-07-07 ENCOUNTER — Telehealth: Payer: Self-pay | Admitting: Urology

## 2018-07-07 ENCOUNTER — Encounter: Payer: Self-pay | Admitting: Urology

## 2018-07-07 VITALS — BP 120/84 | HR 120 | Temp 98.1°F

## 2018-07-07 DIAGNOSIS — N200 Calculus of kidney: Secondary | ICD-10-CM | POA: Diagnosis not present

## 2018-07-07 DIAGNOSIS — N2889 Other specified disorders of kidney and ureter: Secondary | ICD-10-CM | POA: Diagnosis not present

## 2018-07-07 DIAGNOSIS — R310 Gross hematuria: Secondary | ICD-10-CM | POA: Diagnosis not present

## 2018-07-07 DIAGNOSIS — M545 Low back pain, unspecified: Secondary | ICD-10-CM

## 2018-07-07 DIAGNOSIS — N3001 Acute cystitis with hematuria: Secondary | ICD-10-CM | POA: Diagnosis not present

## 2018-07-07 DIAGNOSIS — F419 Anxiety disorder, unspecified: Secondary | ICD-10-CM

## 2018-07-07 LAB — URINALYSIS, COMPLETE
Bilirubin, UA: NEGATIVE
Glucose, UA: NEGATIVE
Nitrite, UA: POSITIVE — AB
Protein, UA: NEGATIVE
Specific Gravity, UA: >1.03 — ABNORMAL HIGH (ref 1.005–1.030)
Urobilinogen, Ur: 0.2 mg/dL (ref 0.2–1.0)
pH, UA: 5.5 (ref 5.0–7.5)

## 2018-07-07 LAB — MICROSCOPIC EXAMINATION

## 2018-07-07 MED ORDER — SULFAMETHOXAZOLE-TRIMETHOPRIM 800-160 MG PO TABS
1.0000 | ORAL_TABLET | Freq: Two times a day (BID) | ORAL | 0 refills | Status: DC
Start: 2018-07-07 — End: 2018-07-28

## 2018-07-07 NOTE — Telephone Encounter (Signed)
They have been calling her and are just waiting on her to call them back to schedule.  Amanda [name]

## 2018-07-07 NOTE — Progress Notes (Signed)
n

## 2018-07-07 NOTE — Progress Notes (Signed)
07/07/2018 1:43 PM   Amanda [name] 11-Apr-1981 [id number]  Referring provider: Emogene Morgan, MD [address] [place], [place] [address]  Chief Complaint  Patient presents with  . Hematuria    HPI: 37 year old female with a history of anxiety and fibromyalgia who presents today for further evaluation of gross hematuria.  She was initially seen and evaluated by Michiel Cowboy for gross hematuria evaluation.  She underwent CT urogram on 06/11/2018 which showed a nonobstructing 10 mm right upper pole stone with no other stones within the collecting system seen.  She did have an incidental indeterminate renal mass x2 in the left lower pole.  She was scheduled to have an MRI for further evaluation of which is ordered but not yet scheduled.  In the interim, she was seen by Michiel Cowboy which time her urinalysis was suspicious for infection.  It ultimately grew E. coli.  She was treated with Cipro.  She returned to the emergency room over the weekend on 07/05/2018 with supposed fevers to 104, increasing pain, discomfort with urination and blood in her urine.  The time, she was taking Azo and her urine was indeterminate due to discoloration.  Urine culture grew 50,000 colonies of undetermined species.  She was afebrile in the emergency room and had no leukocytosis.  She did undergo repeat imaging in the form of renal ultrasound which was essentially stable from her CT urogram.  No hydronephrosis was appreciated.  She had a 101 temp yesterday per her report..  She reports that continues to have left lower back pain situated over her left buttock and ASIS.  This is exacerbated with activity and movement.  She feels that it becomes warm and swollen at times.  She has been using ice packs.  She also endorses pain in her vaginal area.  She reports that the pain is been so severe, she is used some Vicodin from her mom and masturbated for relief.    UA today is nitrite positive and  mildly suspicious with bacteria.  She has no leukocytes in her urine.  She is currently taking Pyridium.  She is extremely anxious in the office today and tearful/crying at times.  She relates that she feels like she is getting mixed messages and nobody really knows what is going on.  PMH: Past Medical History:  Diagnosis Date  . Anxiety   . COPD (chronic obstructive pulmonary disease) (HCC)   . Fibromyalgia   . Kidney stones   . Renal disorder   . UTI (urinary tract infection)     Surgical History: Past Surgical History:  Procedure Laterality Date  . CESAREAN SECTION    . DENTAL SURGERY    . EYE SURGERY    . TUBAL LIGATION      Home Medications:  Allergies as of 07/07/2018      Reactions   Latex Itching, Swelling      Medication List        Accurate as of 07/07/18  1:43 PM. Always use your most recent med list.          albuterol 108 (90 Base) MCG/ACT inhaler Commonly known as:  PROVENTIL HFA;VENTOLIN HFA Inhale 2 puffs into the lungs every 6 (six) hours as needed for wheezing or shortness of breath.   ALPRAZolam 1 MG tablet Commonly known as:  XANAX Take 1 mg by mouth 3 (three) times daily as needed. For anxiety.   amphetamine-dextroamphetamine 5 MG tablet Commonly known as:  ADDERALL Take 5  mg by mouth 2 (two) times daily.   aspirin-acetaminophen-caffeine 250-250-65 MG tablet Commonly known as:  EXCEDRIN MIGRAINE Take by mouth.   diazepam 10 MG tablet Commonly known as:  VALIUM Take one tablet 30 minutes prior to MRI   hydrOXYzine 50 MG tablet Commonly known as:  ATARAX/VISTARIL TAKE 1 TABLET (50 MG TOTAL) BY MOUTH 3 (THREE) TIMES DAILY AS NEEDED FOR ITCHING.   Melatonin 1 MG Tabs Take by mouth.   oxyCODONE-acetaminophen 5-325 MG tablet Commonly known as:  PERCOCET/ROXICET Take 1 tablet by mouth every 6 (six) hours as needed for severe pain. Do not drive while taking as can cause drowsiness.   promethazine 12.5 MG tablet Commonly known as:   PHENERGAN Take 1 tablet (12.5 mg total) by mouth every 6 (six) hours as needed for nausea or vomiting.       Allergies:  Allergies  Allergen Reactions  . Latex Itching and Swelling    Family History: Family History  Problem Relation Age of Onset  . Fibroids Mother   . Kidney Stones Other   . Crohn's disease Sister     Social History:  reports that she has been smoking. She has never used smokeless tobacco. She reports that she drinks alcohol. She reports that she does not use drugs.  ROS: UROLOGY Frequent Urination?: Yes Hard to postpone urination?: Yes Burning/pain with urination?: Yes Get up at night to urinate?: No Leakage of urine?: No Urine stream starts and stops?: Yes Trouble starting stream?: Yes Do you have to strain to urinate?: Yes Blood in urine?: Yes Urinary tract infection?: Yes Sexually transmitted disease?: No Injury to kidneys or bladder?: No Painful intercourse?: No Weak stream?: No Currently pregnant?: No Vaginal bleeding?: No Last menstrual period?: n  Gastrointestinal Nausea?: No Vomiting?: No Indigestion/heartburn?: Yes Diarrhea?: Yes Constipation?: Yes  Constitutional Fever: Yes Night sweats?: Yes Weight loss?: Yes Fatigue?: Yes  Skin Skin rash/lesions?: No Itching?: No  Eyes Blurred vision?: No Double vision?: No  Ears/Nose/Throat Sore throat?: No Sinus problems?: No  Hematologic/Lymphatic Swollen glands?: No Easy bruising?: Yes  Cardiovascular Leg swelling?: Yes Chest pain?: No  Respiratory Cough?: No Shortness of breath?: Yes  Endocrine Excessive thirst?: Yes  Musculoskeletal Back pain?: Yes Joint pain?: Yes  Neurological Headaches?: Yes Dizziness?: Yes  Psychologic Depression?: Yes Anxiety?: Yes  Physical Exam: BP 120/84   Pulse (!) 120   Temp 98.1 F (36.7 C)   LMP 07/02/2018   Constitutional:  Alert and oriented, No acute distress. HEENT: [place] AT, moist mucus membranes.  Trachea midline, no  masses. Cardiovascular: No clubbing, cyanosis, or edema. Respiratory: Normal respiratory effort, no increased work of breathing. GI: Abdomen is soft, nontender, nondistended, no abdominal masses GU: No CVA tenderness.  She does endorse reproducible tenderness overlying the left iliac crest and upper buttock area with palpation. Skin: No rashes, bruises or suspicious lesions. Neurologic: Grossly intact, no focal deficits, moving all 4 extremities. Psychiatric: Extremely anxious, tearful.  Laboratory Data: Lab Results  Component Value Date   WBC 8.8 07/05/2018   HGB 12.4 07/05/2018   HCT 36.6 07/05/2018   MCV 93.1 07/05/2018   PLT 347 07/05/2018    Lab Results  Component Value Date   CREATININE 0.79 07/05/2018    Urinalysis Results for orders placed or performed in visit on 07/07/18  Microscopic Examination  Result Value Ref Range   WBC, UA 0-5 0 - 5 /hpf   RBC, UA 3-10 (A) 0 - 2 /hpf   Epithelial Cells (non renal) 0-10  0 - 10 /hpf   Mucus, UA Present (A) Not Estab.   Bacteria, UA Many (A) None seen/Few  Urinalysis, Complete  Result Value Ref Range   Specific Gravity, UA >1.030 (H) 1.005 - 1.030   pH, UA 5.5 5.0 - 7.5   Color, UA Yellow Yellow   Appearance Ur Cloudy (A) Clear   Leukocytes, UA 1+ (A) Negative   Protein, UA Negative Negative/Trace   Glucose, UA Negative Negative   Ketones, UA Trace (A) Negative   RBC, UA 1+ (A) Negative   Bilirubin, UA Negative Negative   Urobilinogen, Ur 0.2 0.2 - 1.0 mg/dL   Nitrite, UA Positive (A) Negative   Microscopic Examination See below:     Pertinent Imaging:. Results for orders placed during the hospital encounter of 07/05/18  US RENAL   Narrative CLINICAL DATA:  Bilateral flank pain.  Hematuria.  EXAM: RENAL / URINARY TRACT ULTRASOUND COMPLETE  COMPARISON:  CT scan June 10, 2018  FINDINGS: Right Kidney:  Length: 12 cm. Contains an 11 mm superior pole stone without hydronephrosis. Contains 2 cysts measuring 11  and 8 mm.  Left Kidney:  Length: 12 cm.  Contains a 14 mm complicated cystic mass.  Bladder:  Appears normal for degree of bladder distention.  IMPRESSION: 1. Nonobstructive stone in the upper right kidney. 2. There is a complicated cystic mass in the lower pole of the left kidney. An MRI of the abdomen without and with contrast was recommended for this lesion on the CT scan from June 10, 2018. Recommend MRI if not previously performed. 3. Right renal cyst.   Electronically Signed   By: Gerome Sam III M.D   On: 07/05/2018 16:53    No results found for this or any previous visit. Results for orders placed during the hospital encounter of 06/10/18  CT HEMATURIA WORKUP   Narrative CLINICAL DATA:  Gross hematuria. History of nephrolithiasis and urinary tract infection.  EXAM: CT ABDOMEN AND PELVIS WITHOUT AND WITH CONTRAST  TECHNIQUE: Multidetector CT imaging of the abdomen and pelvis was performed following the standard protocol before and following the bolus administration of intravenous contrast.  CONTRAST:  ISOVUE-300 IOPAMIDOL (ISOVUE-300) INJECTION 61%  COMPARISON:  03/19/2015 CT abdomen/pelvis.  FINDINGS: Lower chest: No significant pulmonary nodules or acute consolidative airspace disease.  Hepatobiliary: Normal liver size. No liver mass. Normal gallbladder with no radiopaque cholelithiasis. No biliary ductal dilatation.  Pancreas: Normal, with no mass or duct dilation.  Spleen: Normal size. No mass.  Adrenals/Urinary Tract: Normal adrenals. Nonobstructing 10 mm upper right renal stone. No additional renal stones. No hydronephrosis. Normal caliber ureters, with no ureteral stones. Hypodense 1.1 cm interpolar right kidney (series 8/image 36) and hypodense 1.5 cm lower left kidney (series 8/image 38) lesions. Several subcentimeter hypodense renal cortical lesions scattered in both kidneys, too small to characterize, which require no follow-up.  On delayed imaging, there is no urothelial wall thickening and there are no filling defects in the opacified portions of the bilateral collecting systems or ureters. No bladder stones, masses, wall thickening or diverticula.  Stomach/Bowel: Normal non-distended stomach. Normal caliber small bowel with no small bowel wall thickening. Appendix is dilated (11 mm diameter) and otherwise normal. Normal large bowel with no diverticulosis, large bowel wall thickening or pericolonic fat stranding.  Vascular/Lymphatic: Normal caliber abdominal aorta. Patent portal, splenic, hepatic and renal veins. No pathologically enlarged lymph nodes in the abdomen or pelvis.  Reproductive: Grossly normal uterus. Simple 1.3 cm right adnexal cyst, for which  no follow-up is required. No left adnexal mass.  Other: No pneumoperitoneum, ascites or focal fluid collection.  Musculoskeletal: No aggressive appearing focal osseous lesions.  IMPRESSION: 1. Solitary nonobstructing 10 mm upper right renal stone. No hydronephrosis. No additional urolithiasis. 2. Two small indeterminate hypodense renal cortical lesions, largest 1.5 cm in the lower left kidney. MRI abdomen without and with IV contrast is indicated to exclude renal cell carcinoma, although benign renal cysts are more likely. 3. No evidence of urothelial lesions.   Electronically Signed   By: Delbert PhenixJason A Poff M.D.   On: 06/11/2018 08:21    Renal ultrasound and CT urogram were personally reviewed today.  Agree with radiologic interpretation.  Assessment & Plan:    1. Gross hematuria Due for cystoscopy In light of current urinary symptoms and somewhat suspicious UA, will defer and proceed as scheduled in 3 weeks - Urinalysis, Complete - CULTURE, URINE COMPREHENSIVE  2. Right kidney stone 10 mm right nonobstructing kidney stone, asymptomatic We will discuss management at her next follow-up visit but given the size of the stone, consideration with  either shockwave lithotripsy or ureteroscopy should be considered  3. Left renal mass Indeterminate cystic structure on CT urogram MRI previously ordered but not scheduled She was confused about renal ultrasound versus CT scan versus MRI She was educated that MRI will help further characterize the lesion and possibly rule out kidney cancer We will work to ensure the study schedule prior to her follow-up visit  4. Acute cystitis with hematuria UA today mildly suspicious -nitrate positive could be from Pyridium Given the severity of her symptoms, we will go ahead and treat with another week of antibiotics, Bactrim prescribed this time Urine culture sent again today  5. Acute left-sided low back pain without sciatica Based on the location and nature of her pain as well as reproducibility with palpation overlying musculoskeletal structures, I am highly suspicious that this is musculoskeletal in nature and not at all related to her GU tract In addition, renal ultrasound was reassuring without evidence of hydronephrosis as well as now leukocytosis She does have a personal history of fibromyalgia and this may be related to a flare I have recommended supportive care with heat/ice and NSAIDs and rest I would encourage her to follow-up with her primary care physician as well  6. Anxiety Extremely anxious today in clinic which I believe is exacerbating her symptoms I tried to educate and reassure her today that we will work through this in a stepwise fashion to ensure that she has no serious pathology although this is not suspected   Return in about 4 weeks (around 08/04/2018) for f/u MRI (please ensure scheduled), cystoscopy.   Vanna ScotlandAshley Caedyn Raygoza, MD  [facility] [address], [address] [place], [place] [id number] [phone]  I spent 40 min with this patient of which greater than 50% was spent in counseling and coordination of care with the patient.

## 2018-07-08 LAB — URINE CULTURE

## 2018-07-08 NOTE — Telephone Encounter (Signed)
Yes, I will have scheduling call her again   Amanda [name]

## 2018-07-08 NOTE — Telephone Encounter (Signed)
-----   Message from Amanda ScotlandAshley Brandon, MD sent at 07/07/2018  5:36 PM EDT ----- Can you please make sure this MRI gets done before her follow up?

## 2018-07-09 ENCOUNTER — Telehealth: Payer: Self-pay

## 2018-07-09 LAB — CULTURE, URINE COMPREHENSIVE

## 2018-07-09 NOTE — Telephone Encounter (Signed)
-----   Message from Vanna ScotlandAshley Brandon, MD sent at 07/09/2018  7:51 AM EDT ----- UCx grew mixed flora, likely from contamination.  Go ahead and complete 3 days of the abx prescribed then hold off in the 7 day course.    Vanna ScotlandAshley Brandon, MD

## 2018-07-09 NOTE — Telephone Encounter (Signed)
Left message for pt to call office

## 2018-07-15 NOTE — Telephone Encounter (Signed)
Left detailed message making sure pt had recovered well and finished abx.

## 2018-07-15 NOTE — Telephone Encounter (Signed)
Patient returned call.  She stated that she is doing okay.  She completed her antibiotics.    Her mother requested that we transfer her records to [facility].  She stated that she will come by the office tomorrow to fill out and sign a release for her medical records.

## 2018-07-28 ENCOUNTER — Encounter: Payer: Self-pay | Admitting: Urology

## 2018-07-28 ENCOUNTER — Ambulatory Visit (INDEPENDENT_AMBULATORY_CARE_PROVIDER_SITE_OTHER): Payer: Medicaid Other | Admitting: Urology

## 2018-07-28 VITALS — BP 109/69 | HR 101

## 2018-07-28 DIAGNOSIS — N2889 Other specified disorders of kidney and ureter: Secondary | ICD-10-CM

## 2018-07-28 DIAGNOSIS — R310 Gross hematuria: Secondary | ICD-10-CM | POA: Diagnosis not present

## 2018-07-28 DIAGNOSIS — N200 Calculus of kidney: Secondary | ICD-10-CM

## 2018-07-28 MED ORDER — LIDOCAINE HCL URETHRAL/MUCOSAL 2 % EX GEL
1.0000 | Freq: Once | CUTANEOUS | Status: AC
Start: 2018-07-28 — End: 2018-07-28
  Administered 2018-07-28: 1 via URETHRAL

## 2018-07-28 NOTE — Progress Notes (Signed)
   07/28/18  CC:  Chief Complaint  Patient presents with  . Cysto    HPI: 37 year old female with multiple GU issues who presents today for cystoscopy.  She has not had her MRI as she initially was possibly go to transfer care but ultimately elected not to.  Please see previous note for details.  Blood pressure 109/69, pulse (!) 101, last menstrual period 07/02/2018. NED. A&Ox3.   No respiratory distress   Abd soft, NT, ND Normal external genitalia with patent urethral meatus  Cystoscopy Procedure Note  Patient identification was confirmed, informed consent was obtained, and patient was prepped using Betadine solution.  Lidocaine jelly was administered per urethral meatus.    Preoperative abx where received prior to procedure.    Procedure: - Flexible cystoscope introduced, without any difficulty.   - Thorough search of the bladder revealed:    normal urethral meatus    normal urothelium    no stones    no ulcers     no tumors    no urethral polyps    no trabeculation  - Ureteral orifices were normal in position and appearance.  Post-Procedure: - Patient tolerated the procedure well  Assessment/ Plan:  1. Gross hematuria Status post negative cystoscopy today Below evaluation  - Urinalysis, Complete - lidocaine (XYLOCAINE) 2 % jelly 1 application  2. Left renal mass MRI ordered She was assisted today with rescheduling this She will follow-up after MRI is completed  3. Right kidney stone Will discuss treatment of the stone when she returns  Follow-up after renal MRI, we discussed the importance of following up with this in a timely manner  Vanna Scotland, MD

## 2018-07-29 ENCOUNTER — Telehealth: Payer: Self-pay | Admitting: Urology

## 2018-07-29 LAB — URINALYSIS, COMPLETE
Bilirubin, UA: NEGATIVE
Glucose, UA: NEGATIVE
Ketones, UA: NEGATIVE
Leukocytes, UA: NEGATIVE
Nitrite, UA: NEGATIVE
Specific Gravity, UA: >1.03 — ABNORMAL HIGH (ref 1.005–1.030)
Urobilinogen, Ur: 0.2 mg/dL (ref 0.2–1.0)
pH, UA: 5.5 (ref 5.0–7.5)

## 2018-07-29 LAB — MICROSCOPIC EXAMINATION
Bacteria, UA: NONE SEEN
RBC, UA: >30 /hpf — AB (ref 0–2)

## 2018-07-29 NOTE — Telephone Encounter (Signed)
Pt has her MRI scheduled in [place] for 9/18, pt calls office asking for something for her claustrophobia to be called in at [facility] in [place] prior to her MRI. Please advise pt at [phone].

## 2018-07-29 NOTE — Telephone Encounter (Signed)
This prescription was already supplied to her on 06/22/2018 by Michiel Cowboy for this very purpose.  Vanna Scotland, MD

## 2018-08-12 ENCOUNTER — Ambulatory Visit
Admission: RE | Admit: 2018-08-12 | Discharge: 2018-08-12 | Disposition: A | Discharge status: Home / Self Care | Payer: Medicaid Other | Source: Ambulatory Visit | Attending: Urology | Admitting: Urology

## 2018-08-12 DIAGNOSIS — N281 Cyst of kidney, acquired: Secondary | ICD-10-CM | POA: Diagnosis not present

## 2018-08-12 DIAGNOSIS — N2889 Other specified disorders of kidney and ureter: Secondary | ICD-10-CM | POA: Diagnosis present

## 2018-08-12 MED ORDER — GADOBENATE DIMEGLUMINE 529 MG/ML IV SOLN
15.0000 mL | Freq: Once | INTRAVENOUS | Status: AC | PRN
  Administered 2018-08-12: 14 mL via INTRAVENOUS

## 2018-08-19 ENCOUNTER — Ambulatory Visit: Payer: Medicaid Other | Admitting: Urology

## 2018-08-19 ENCOUNTER — Telehealth: Payer: Self-pay | Admitting: Urology

## 2018-08-19 ENCOUNTER — Encounter: Payer: Self-pay | Admitting: Urology

## 2018-08-19 NOTE — Telephone Encounter (Signed)
Please let Amanda [name] know that the area in her kidney is benign and does not need further follow up.  She does have a kidney stone and if she desires treatment, she will need to come in for an appointment to discuss treatment options.

## 2018-08-20 NOTE — Telephone Encounter (Signed)
Unable to leave message, phone is not working at this time

## 2018-08-21 NOTE — Telephone Encounter (Signed)
Unable to reach pt, I have sent her a letter regarding her imaging.

## 2018-09-09 ENCOUNTER — Encounter: Payer: Self-pay | Admitting: Emergency Medicine

## 2018-09-09 ENCOUNTER — Ambulatory Visit
Admission: EM | Admit: 2018-09-09 | Discharge: 2018-09-09 | Disposition: A | Discharge status: Home / Self Care | Payer: Medicaid Other | Attending: Family Medicine | Admitting: Family Medicine

## 2018-09-09 ENCOUNTER — Other Ambulatory Visit: Payer: Self-pay

## 2018-09-09 DIAGNOSIS — N760 Acute vaginitis: Secondary | ICD-10-CM

## 2018-09-09 DIAGNOSIS — F17210 Nicotine dependence, cigarettes, uncomplicated: Secondary | ICD-10-CM | POA: Diagnosis not present

## 2018-09-09 DIAGNOSIS — F329 Major depressive disorder, single episode, unspecified: Secondary | ICD-10-CM | POA: Insufficient documentation

## 2018-09-09 DIAGNOSIS — K589 Irritable bowel syndrome without diarrhea: Secondary | ICD-10-CM | POA: Insufficient documentation

## 2018-09-09 DIAGNOSIS — M549 Dorsalgia, unspecified: Secondary | ICD-10-CM | POA: Diagnosis present

## 2018-09-09 DIAGNOSIS — Z6839 Body mass index (BMI) 39.0-39.9, adult: Secondary | ICD-10-CM | POA: Diagnosis not present

## 2018-09-09 DIAGNOSIS — J449 Chronic obstructive pulmonary disease, unspecified: Secondary | ICD-10-CM | POA: Diagnosis not present

## 2018-09-09 DIAGNOSIS — M797 Fibromyalgia: Secondary | ICD-10-CM | POA: Diagnosis not present

## 2018-09-09 DIAGNOSIS — N390 Urinary tract infection, site not specified: Secondary | ICD-10-CM | POA: Diagnosis not present

## 2018-09-09 DIAGNOSIS — Z79899 Other long term (current) drug therapy: Secondary | ICD-10-CM | POA: Insufficient documentation

## 2018-09-09 DIAGNOSIS — B9689 Other specified bacterial agents as the cause of diseases classified elsewhere: Secondary | ICD-10-CM | POA: Diagnosis not present

## 2018-09-09 DIAGNOSIS — Z87442 Personal history of urinary calculi: Secondary | ICD-10-CM | POA: Insufficient documentation

## 2018-09-09 DIAGNOSIS — E669 Obesity, unspecified: Secondary | ICD-10-CM | POA: Insufficient documentation

## 2018-09-09 LAB — URINALYSIS, COMPLETE (UACMP) WITH MICROSCOPIC
Bilirubin Urine: NEGATIVE
Glucose, UA: NEGATIVE mg/dL
Hgb urine dipstick: NEGATIVE
Ketones, ur: NEGATIVE mg/dL
Nitrite: NEGATIVE
Protein, ur: NEGATIVE mg/dL
RBC / HPF: NONE SEEN RBC/hpf (ref 0–5)
Specific Gravity, Urine: 1.02 (ref 1.005–1.030)
pH: 7.5 (ref 5.0–8.0)

## 2018-09-09 LAB — WET PREP, GENITAL
Sperm: NONE SEEN
Trich, Wet Prep: NONE SEEN
Yeast Wet Prep HPF POC: NONE SEEN

## 2018-09-09 LAB — CHLAMYDIA/NGC RT PCR (ARMC ONLY)
Chlamydia Tr: NOT DETECTED
N gonorrhoeae: NOT DETECTED

## 2018-09-09 MED ORDER — ONDANSETRON 8 MG PO TBDP: 8.0000 mg | ORAL_TABLET | Freq: Three times a day (TID) | ORAL | 0 refills | Status: DC | PRN

## 2018-09-09 MED ORDER — METRONIDAZOLE 500 MG PO TABS: 500.0000 mg | ORAL_TABLET | Freq: Two times a day (BID) | ORAL | 0 refills | Status: DC

## 2018-09-09 MED ORDER — CEPHALEXIN 500 MG PO CAPS: 500.0000 mg | ORAL_CAPSULE | Freq: Two times a day (BID) | ORAL | 0 refills | Status: DC

## 2018-09-09 NOTE — ED Provider Notes (Signed)
[facility]    CSN: [id number] Arrival date & time: 09/09/18  1708     History   Chief Complaint Chief Complaint  Patient presents with  . Back Pain  . Vaginal Discharge    HPI Amanda [name] is a 37 y.o. female.   37 yo female with a c/o low back pain, mild discomfort with urination and vaginal discharge. Denies any fevers, chills.   The history is provided by the patient.  Back Pain  Associated symptoms: dysuria   Associated symptoms: no abdominal pain, no bladder incontinence and no fever   Vaginal Discharge  Quality:  [place] quality:  Sudden Duration:  4 days Timing:  Constant Progression:  Worsening Chronicity:  New Context: spontaneously   Relieved by:  None tried Ineffective treatments:  None tried Associated symptoms: dysuria   Associated symptoms: no abdominal pain, no dyspareunia, no fever, no genital lesions, no nausea, no rash, no urinary frequency, no urinary hesitancy, no urinary incontinence, no vaginal itching and no vomiting     Past Medical History:  Diagnosis Date  . Anxiety   . COPD (chronic obstructive pulmonary disease) (HCC)   . Fibromyalgia   . Kidney stones   . Renal disorder   . UTI (urinary tract infection)     Patient Active Problem List   Diagnosis Date Noted  . Attention deficit disorder of adult 01/01/2016  . Nephrolithiasis 01/30/2015  . Panic attack 05/30/2014  . Obesity (BMI 30-39.9) 03/28/2014  . Depression 12/21/2013  . Fibromyalgia 06/21/2013  . Hemorrhage of rectum and anus 01/05/2013  . Low back pain 12/22/2012  . 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 CESAREAN SECTION    . DENTAL SURGERY    . EYE SURGERY    . TUBAL LIGATION      OB History   None      Home Medications    Prior to Admission medications   Medication Sig Start Date End Date Taking? Authorizing Provider  amphetamine-dextroamphetamine (ADDERALL) 5 MG tablet Take 5 mg by  mouth 2 (two) times daily. 11/08/15  Yes [provider]  albuterol (PROVENTIL HFA;VENTOLIN HFA) 108 (90 Base) MCG/ACT inhaler Inhale 2 puffs into the lungs every 6 (six) hours as needed for wheezing or shortness of breath.    [provider]  ALPRAZolam Prudy Feeler) 1 MG tablet Take 1 mg by mouth 3 (three) times daily as needed. For anxiety. 11/08/15   [provider]  aspirin-acetaminophen-caffeine (EXCEDRIN MIGRAINE) [phone] MG tablet Take by mouth.    [provider]  cephALEXin (KEFLEX) 500 MG capsule Take 1 capsule (500 mg total) by mouth 2 (two) times daily. 09/09/18   Payton Mccallum, MD  diazepam (VALIUM) 10 MG tablet Take one tablet 30 minutes prior to MRI Patient not taking: Reported on 07/28/2018 06/22/18   Michiel Cowboy A, PA-C  Melatonin 1 MG TABS Take by mouth.    [provider]  metroNIDAZOLE (FLAGYL) 500 MG tablet Take 1 tablet (500 mg total) by mouth 2 (two) times daily. 09/09/18   Payton Mccallum, MD  ondansetron (ZOFRAN ODT) 8 MG disintegrating tablet Take 1 tablet (8 mg total) by mouth every 8 (eight) hours as needed. 09/09/18   Payton Mccallum, MD    Family History Family History  Problem Relation Age of Onset  . Fibroids Mother   . Kidney Stones Other   . Crohn's disease Sister     Social History Social History  Tobacco Use  . Smoking status: Current Every Day Smoker    Types: Cigarettes  . Smokeless tobacco: Never Used  Substance Use Topics  . Alcohol use: Yes  . Drug use: Never     Allergies   Latex   Review of Systems Review of Systems  Constitutional: Negative for fever.  Gastrointestinal: Negative for abdominal pain, nausea and vomiting.  Genitourinary: Positive for dysuria and vaginal discharge. Negative for bladder incontinence, dyspareunia and hesitancy.  Musculoskeletal: Positive for back pain.     Physical Exam Triage Vital Signs ED Triage Vitals  Enc Vitals Group     BP 09/09/18 1721 (!)  151/95     Pulse Rate 09/09/18 1721 92     Resp 09/09/18 1721 14     Temp 09/09/18 1721 98.1 F (36.7 C)     Temp Source 09/09/18 1721 Oral     SpO2 09/09/18 1721 100 %     Weight 09/09/18 1717 160 lb (72.6 kg)     Height 09/09/18 1717 5\' 3"  (1.6 m)     Head Circumference --      Peak Flow --      Pain Score 09/09/18 1717 7     Pain Loc --      Pain Edu? --      Excl. in GC? --    No data found.  Updated Vital Signs BP (!) 151/95 (BP Location: Left Arm)   Pulse 92   Temp 98.1 F (36.7 C) (Oral)   Resp 14   Ht 5\' 3"  (1.6 m)   Wt 72.6 kg   LMP 08/28/2018 (Approximate)   SpO2 100%   BMI 28.34 kg/m   Visual Acuity Right Eye Distance:   Left Eye Distance:   Bilateral Distance:    Right Eye Near:   Left Eye Near:    Bilateral Near:     Physical Exam  Constitutional: She appears well-developed and well-nourished. No distress.  Abdominal: Soft. Bowel sounds are normal. She exhibits no distension and no mass. There is no tenderness. There is no rebound and no guarding.  Musculoskeletal:       Lumbar back: She exhibits tenderness (over the lumbar paraspinous muscles) and spasm. She exhibits normal range of motion, no bony tenderness, no swelling, no edema, no deformity, no laceration, no pain and normal pulse.  Skin: She is not diaphoretic.  Nursing note and vitals reviewed.    UC Treatments / Results  Labs (all labs ordered are listed, but only abnormal results are displayed) Labs Reviewed  WET PREP, GENITAL - Abnormal; Notable for the following components:      Result Value   Clue Cells Wet Prep HPF POC PRESENT (*)    WBC, Wet Prep HPF POC MODERATE (*)    All other components within normal limits  URINALYSIS, COMPLETE (UACMP) WITH MICROSCOPIC - Abnormal; Notable for the following components:   Leukocytes, UA TRACE (*)    Bacteria, UA FEW (*)    All other components within normal limits  CHLAMYDIA/NGC RT PCR ([facility] ONLY)  URINE CULTURE     EKG None  Radiology No results found.  Procedures Procedures (including critical care time)  Medications Ordered in UC Medications - No data to display  Initial Impression / Assessment and Plan / UC Course  I have reviewed the triage vital signs and the nursing notes.  Pertinent labs & imaging results that were available during my care of the patient were reviewed by me and considered in  my medical decision making (see chart for details).      Final Clinical Impressions(s) / UC Diagnoses   Final diagnoses:  BV (bacterial vaginosis)  Urinary tract infection without hematuria, site unspecified   Discharge Instructions   None    ED Prescriptions    Medication Sig Dispense Auth. Provider   cephALEXin (KEFLEX) 500 MG capsule Take 1 capsule (500 mg total) by mouth 2 (two) times daily. 14 capsule Felder Lebeda, Pamala Hurry, MD   metroNIDAZOLE (FLAGYL) 500 MG tablet Take 1 tablet (500 mg total) by mouth 2 (two) times daily. 14 tablet Terion Hedman, Pamala Hurry, MD   ondansetron (ZOFRAN ODT) 8 MG disintegrating tablet Take 1 tablet (8 mg total) by mouth every 8 (eight) hours as needed. 6 tablet Payton Mccallum, MD     1. Lab results and diagnosis reviewed with patient 2. rx as per orders above; reviewed possible side effects, interactions, risks and benefits  3. Recommend supportive treatment with  4. Follow-up prn if symptoms worsen or don't improve   Controlled Substance Prescriptions [place] Controlled Substance Registry consulted? Not Applicable   Payton Mccallum, MD 09/09/18 [phone]

## 2018-09-09 NOTE — ED Triage Notes (Signed)
Patient c/o lower back pain and vaginal discharge that started getting worse for the past 4 days.

## 2018-09-11 LAB — URINE CULTURE: Culture: NO GROWTH

## 2018-09-15 ENCOUNTER — Telehealth: Payer: Self-pay | Admitting: Urology

## 2018-09-15 ENCOUNTER — Encounter: Payer: Self-pay | Admitting: Urology

## 2018-09-15 ENCOUNTER — Ambulatory Visit (INDEPENDENT_AMBULATORY_CARE_PROVIDER_SITE_OTHER): Payer: Medicaid Other | Admitting: Urology

## 2018-09-15 VITALS — BP 124/86 | HR 121 | Ht 63.0 in | Wt 165.0 lb

## 2018-09-15 DIAGNOSIS — N898 Other specified noninflammatory disorders of vagina: Secondary | ICD-10-CM | POA: Diagnosis not present

## 2018-09-15 DIAGNOSIS — N200 Calculus of kidney: Secondary | ICD-10-CM

## 2018-09-15 DIAGNOSIS — R102 Pelvic and perineal pain: Secondary | ICD-10-CM | POA: Diagnosis not present

## 2018-09-15 DIAGNOSIS — N281 Cyst of kidney, acquired: Secondary | ICD-10-CM | POA: Diagnosis not present

## 2018-09-15 NOTE — Progress Notes (Signed)
09/15/2018 4:26 PM   Amanda [name] 06/17/1981 [id number]  Referring provider: Emogene Morgan, MD [address] [place], [place] [address]  Chief Complaint  Patient presents with  . Nephrolithiasis    discuss stone management    HPI: 37 year old female with multiple GU issues who returns today for follow-up of her MRI.  She initially was referred for evaluation of a nonobstructing kidney stone and gross hematuria.  Work-up for this included CT urogram which showed incidental renal lesions which was ultimately followed up with an MRI.  MRI showed benign Bosniak 2 renal cysts.  She also underwent cystoscopy in 07/2018 which was unremarkable.  In the interim, she has been treated for urinary symptoms with and without positive urine cultures.  Today, she primarily would like to discuss 2 weeks of lower abdominal pressure pain and discharge.  She was seen for the same complaint in the emergency room on 09/09/2018 at which time her urine was completely clear.  Clue cells were identified on vaginal exam and she was treated with Flagyl for presumed bacterial vaginosis.  She denies any dysuria but describes her urine as "warm".  She has constant urgency without incontinence.  At nighttime, she is able to sleep throughout the night without any urinary symptoms.  She reports no improvement with a round of antibiotics underscore requesting more antibiotics today.  She does also continue to have discomfort across her low back and previously appreciated to be situated over her paraspinous muscles most consistent with muscular skeletal pain.  She does have follow-up with her primary care, Dr. Letta Pate next month.  She is requesting referral to OB/GYN as she has a complex GYN history, previously followed by Dr. Barnabas Lister per her report.  She denies any alleviating or exacerbating symptoms.  Personal history of anxiety, fibromyalgia.   PMH: Past Medical History:  Diagnosis Date  .  Anxiety   . COPD (chronic obstructive pulmonary disease) (HCC)   . Fibromyalgia   . Kidney stones   . Renal disorder   . UTI (urinary tract infection)     Surgical History: Past Surgical History:  Procedure Laterality Date  . CESAREAN SECTION    . DENTAL SURGERY    . EYE SURGERY    . TUBAL LIGATION      Home Medications:  Allergies as of 09/15/2018      Reactions   Latex Itching, Swelling      Medication List        Accurate as of 09/15/18  4:26 PM. Always use your most recent med list.          albuterol 108 (90 Base) MCG/ACT inhaler Commonly known as:  PROVENTIL HFA;VENTOLIN HFA Inhale 2 puffs into the lungs every 6 (six) hours as needed for wheezing or shortness of breath.   ALPRAZolam 1 MG tablet Commonly known as:  XANAX Take 1 mg by mouth 3 (three) times daily as needed. For anxiety.   amphetamine-dextroamphetamine 5 MG tablet Commonly known as:  ADDERALL Take 5 mg by mouth 2 (two) times daily.   aspirin-acetaminophen-caffeine 250-250-65 MG tablet Commonly known as:  EXCEDRIN MIGRAINE Take by mouth.   Melatonin 1 MG Tabs Take by mouth.       Allergies:  Allergies  Allergen Reactions  . Latex Itching and Swelling    Family History: Family History  Problem Relation Age of Onset  . Fibroids Mother   . Kidney Stones Other   . Crohn's disease Sister  Social History:  reports that she has been smoking cigarettes. She has never used smokeless tobacco. She reports that she drinks alcohol. She reports that she does not use drugs.  ROS: UROLOGY Frequent Urination?: No Hard to postpone urination?: No Burning/pain with urination?: Yes Get up at night to urinate?: Yes Leakage of urine?: Yes Urine stream starts and stops?: Yes Trouble starting stream?: Yes Do you have to strain to urinate?: No Blood in urine?: No Urinary tract infection?: No Sexually transmitted disease?: No Injury to kidneys or bladder?: No Painful intercourse?: No Weak  stream?: No Currently pregnant?: No Vaginal bleeding?: No Last menstrual period?: n  Gastrointestinal Nausea?: No Vomiting?: No Indigestion/heartburn?: No Diarrhea?: Yes Constipation?: Yes  Constitutional Fever: No Night sweats?: No Weight loss?: No Fatigue?: Yes  Skin Skin rash/lesions?: No Itching?: No  Eyes Blurred vision?: Yes Double vision?: No  Ears/Nose/Throat Sore throat?: No Sinus problems?: No  Hematologic/Lymphatic Swollen glands?: No Easy bruising?: No  Cardiovascular Leg swelling?: No Chest pain?: No  Respiratory Cough?: No Shortness of breath?: No  Endocrine Excessive thirst?: No  Musculoskeletal Back pain?: No Joint pain?: No  Neurological Headaches?: No Dizziness?: No  Psychologic Depression?: No Anxiety?: No  Physical Exam: BP 124/86   Pulse (!) 121   Ht 5\' 3"  (1.6 m)   Wt 165 lb (74.8 kg)   LMP 08/28/2018 (Approximate)   BMI 29.23 kg/m   Constitutional:  Alert and oriented, No acute distress.  W by young daughter today. HEENT: [place] AT, moist mucus membranes.  Trachea midline, no masses. Cardiovascular: No clubbing, cyanosis, or edema. Respiratory: Normal respiratory effort, no increased work of breathing. Skin: No rashes, bruises or suspicious lesions. Neurologic: Grossly intact, no focal deficits, moving all 4 extremities. Psychiatric: Normal mood and affect.  Less anxious today than on previous visits.  Laboratory Data: Lab Results  Component Value Date   WBC 8.8 07/05/2018   HGB 12.4 07/05/2018   HCT 36.6 07/05/2018   MCV 93.1 07/05/2018   PLT 347 07/05/2018    Lab Results  Component Value Date   CREATININE 0.79 07/05/2018    Urinalysis Component     Latest Ref Rng & Units 09/09/2018  Color, Urine     YELLOW YELLOW  Appearance     CLEAR CLEAR  Glucose, UA     NEGATIVE mg/dL NEGATIVE  Bilirubin Urine     NEGATIVE NEGATIVE  Ketones, ur     NEGATIVE mg/dL NEGATIVE  Specific Gravity, Urine     1.005 -  1.030 1.020  Hgb urine dipstick     NEGATIVE NEGATIVE  pH     5.0 - 8.0 7.5  Protein     NEGATIVE mg/dL NEGATIVE  Nitrite     NEGATIVE NEGATIVE  Leukocytes, UA     NEGATIVE TRACE (A)  RBC / HPF     0 - 5 RBC/hpf NONE SEEN  WBC, UA     0 - 5 WBC/hpf 0-5  Bacteria, UA     NONE SEEN FEW (A)  Squamous Epithelial / LPF     0 - 5 0-5    Pertinent Imaging: CLINICAL DATA:  Complex cystic lesion of left kidney on recent CT. Hematuria.  EXAM: MRI ABDOMEN WITHOUT AND WITH CONTRAST  TECHNIQUE: Multiplanar multisequence MR imaging of the abdomen was performed both before and after the administration of intravenous contrast.  CONTRAST:  14mL MULTIHANCE GADOBENATE DIMEGLUMINE 529 MG/ML IV SOLN  COMPARISON:  CT on 06/10/2018 and 05/09/2010  FINDINGS: Lower chest: No  acute findings.  Hepatobiliary: No hepatic masses identified. Gallbladder is unremarkable. No evidence of biliary ductal dilatation.  Pancreas:  No mass or inflammatory changes.  Spleen:  Within normal limits in size and appearance.  Adrenals/Urinary Tract: Several small benign Bosniak category 1 cysts are seen in both kidneys. A 1.9 cm T1 hyperintense cyst with no contrast enhancement is seen in the lower pole the left kidney. This is consistent with a benign Bosniak category 2 cyst, corresponds with the lesion seen on prior CTs, and has also slightly decreased in size since 2011 CT. No suspicious complex cystic or solid masses are seen involving either kidney. No evidence of hydronephrosis.  Stomach/Bowel: Visualized portion unremarkable.  Vascular/Lymphatic: No pathologically enlarged lymph nodes identified. No abdominal aortic aneurysm.  Other:  None.  Musculoskeletal:  No suspicious bone lesions identified.  IMPRESSION: 1.9 cm benign Bosniak category 2 cyst in lower pole of left kidney, corresponding with lesion seen on recent CT. No evidence of renal neoplasm or other significant  abnormality.   Electronically Signed   By: Myles Rosenthal M.D.   On: 08/12/2018 16:16  MRI personally reviewed today.  Assessment & Plan:    1. Vaginal discharge Requesting referral to OB/GYN Minimal improvement with Flagyl for treatment of bacterial vaginosis - Ambulatory referral to Obstetrics / Gynecology  2. Pelvic pain in female Lower abdominal pressure, nonspecific and not necessarily related to urinary tract Previous cystoscopy unremarkable We did briefly discussed the possibility of interstitial cystitis today as an underlying issue and she was given literature as well as dietary information to help assess whether or not eliminating irritating foods helps with her lower abdominal pressure No evidence of UTI on most recent UA  3. Renal cyst Benign Bosniak 2 renal cyst Reviewed with patient today, no indication for further evaluation or surveillance  4. Renal stone 10 mm right upper pole nonobstructing stone We discussed that nonobstructing stones are not generally painful/10 to be asymptomatic and not likely the source of her lower back pain as previously discussed That being said, the stone is fairly large and would likely require intervention if it passed As such, we discussed various treatment options for the stone which she is interested in We discussed various treatment options including ESWL vs. ureteroscopy, laser lithotripsy, and stent. We discussed the risks and benefits of both including bleeding, infection, damage to surrounding structures, efficacy with need for possible further intervention, and need for temporary ureteral stent.  She is considering moving in the near future.  She is not sure how she like to proceed with her stone.  Will let us know if and when she would like to pursue intervention for her stone in which modality.   Will call if / when she would like to schedule stone surgery  Vanna Scotland, MD  [facility] [address] [place], [place] [address] [phone]  I spent 25 min with this patient of which greater than 50% was spent in counseling and coordination of care with the patient.

## 2018-09-15 NOTE — Telephone Encounter (Signed)
Patient has appointment to be seen in office today.

## 2018-09-15 NOTE — Patient Instructions (Signed)
Ureteroscopy Ureteroscopy is a procedure to check for and treat problems inside part of the urinary tract. In this procedure, a thin, tube-shaped instrument with a light at the end (ureteroscope) is used to look at the inside of the kidneys and the ureters, which are the tubes that carry urine from the kidneys to the bladder. The ureteroscope is inserted into one or both of the ureters. You may need this procedure if you have frequent urinary tract infections (UTIs), blood in your urine, or a stone in one of your ureters. A ureteroscopy can be done to find the cause of urine blockage in a ureter and to evaluate other abnormalities inside the ureters or kidneys. If stones are found, they can be removed during the procedure. Polyps, abnormal tissue, and some types of tumors can also be removed or treated. The ureteroscope may also have a tool to remove tissue to be checked for disease under a microscope (biopsy). Tell a health care provider about: Any allergies you have. All medicines you are taking, including vitamins, herbs, eye drops, creams, and over-the-counter medicines. Any problems you or family members have had with anesthetic medicines. Any blood disorders you have. Any surgeries you have had. Any medical conditions you have. Whether you are pregnant or may be pregnant. What are the risks? Generally, this is a safe procedure. However, problems may occur, including: Bleeding. Infection. Allergic reactions to medicines. Scarring that narrows the ureter (stricture). Creating a hole in the ureter (perforation).  What happens before the procedure? Staying hydrated Follow instructions from your health care provider about hydration, which may include: Up to 2 hours before the procedure - you may continue to drink clear liquids, such as water, clear fruit juice, black coffee, and plain tea.  Eating and drinking restrictions Follow instructions from your health care provider about eating and  drinking, which may include: 8 hours before the procedure - stop eating heavy meals or foods such as meat, fried foods, or fatty foods. 6 hours before the procedure - stop eating light meals or foods, such as toast or cereal. 6 hours before the procedure - stop drinking milk or drinks that contain milk. 2 hours before the procedure - stop drinking clear liquids.  Medicines Ask your health care provider about: Changing or stopping your regular medicines. This is especially important if you are taking diabetes medicines or blood thinners. Taking medicines such as aspirin and ibuprofen. These medicines can thin your blood. Do not take these medicines before your procedure if your health care provider instructs you not to. You may be given antibiotic medicine to help prevent infection. General instructions You may have a urine sample taken to check for infection. Plan to have someone take you home from the hospital or clinic. What happens during the procedure? To reduce your risk of infection: Your health care team will wash or sanitize their hands. Your skin will be washed with soap. An IV tube will be inserted into one of your veins. You will be given one of the following: A medicine to help you relax (sedative). A medicine to make you fall asleep (general anesthetic). A medicine that is injected into your spine to numb the area below and slightly above the injection site (spinal anesthetic). To lower your risk of infection, you may be given an antibiotic medicine by an injection or through the IV tube. The opening from which you urinate (urethra) will be cleaned with a germ-killing solution. The ureteroscope will be passed through your urethra  into your bladder. A salt-water solution will flow through the ureteroscope to fill your bladder. This will help the health care provider see the openings of your ureters more clearly. Then, the ureteroscope will be passed into your ureter. If a  growth is found, a piece of it may be removed so it can be examined under a microscope (biopsy). If a stone is found, it may be removed through the ureteroscope, or the stone may be broken up using a laser, shock waves, or electrical energy. In some cases, if the ureter is too small, a tube may be inserted that keeps the ureter open (ureteral stent). The stent may be left in place for 1 or 2 weeks to keep the ureter open, and then the ureteroscopy procedure will be performed. The scope will be removed, and your bladder will be emptied. The procedure may vary among health care providers and hospitals. What happens after the procedure? Your blood pressure, heart rate, breathing rate, and blood oxygen level will be monitored until the medicines you were given have worn off. You may be asked to urinate. Donot drive for 24 hours if you were given a sedative. This information is not intended to replace advice given to you by your health care provider. Make sure you discuss any questions you have with your health care provider. Document Released: 11/16/2013 Document Revised: 08/27/2016 Document Reviewed: 08/23/2016 Elsevier Interactive Patient Education  [employer]. Lithotripsy Lithotripsy is a treatment that can sometimes help eliminate kidney stones and the pain that they cause. A form of lithotripsy, also known as extracorporeal shock wave lithotripsy, is a nonsurgical procedure that crushes a kidney stone with shock waves. These shock waves pass through your body and focus on the kidney stone. They cause the kidney stone to break up while it is still in the urinary tract. This makes it easier for the smaller pieces of stone to pass in the urine. Tell a health care provider about:  Any allergies you have.  All medicines you are taking, including vitamins, herbs, eye drops, creams, and over-the-counter medicines.  Any blood disorders you have.  Any surgeries you have had.  Any medical  conditions you have.  Whether you are pregnant or may be pregnant.  Any problems you or family members have had with anesthetic medicines. What are the risks? Generally, this is a safe procedure. However, problems may occur, including:  Infection.  Bleeding of the kidney.  Bruising of the kidney or skin.  Scarring of the kidney, which can lead to: ? Increased blood pressure. ? Poor kidney function. ? Return (recurrence) of kidney stones.  Damage to other structures or organs, such as the liver, colon, spleen, or pancreas.  Blockage (obstruction) of the the tube that carries urine from the kidney to the bladder (ureter).  Failure of the kidney stone to break into pieces (fragments).  What happens before the procedure? Staying hydrated Follow instructions from your health care provider about hydration, which may include:  Up to 2 hours before the procedure - you may continue to drink clear liquids, such as water, clear fruit juice, black coffee, and plain tea.  Eating and drinking restrictions Follow instructions from your health care provider about eating and drinking, which may include:  8 hours before the procedure - stop eating heavy meals or foods such as meat, fried foods, or fatty foods.  6 hours before the procedure - stop eating light meals or foods, such as toast or cereal.  6 hours  before the procedure - stop drinking milk or drinks that contain milk.  2 hours before the procedure - stop drinking clear liquids.  General instructions  Plan to have someone take you home from the hospital or clinic.  Ask your health care provider about: ? Changing or stopping your regular medicines. This is especially important if you are taking diabetes medicines or blood thinners. ? Taking medicines such as aspirin and ibuprofen. These medicines and other NSAIDs can thin your blood. Do not take these medicines for 7 days before your procedure if your health care provider  instructs you not to.  You may have tests, such as: ? Blood tests. ? Urine tests. ? Imaging tests, such as a CT scan. What happens during the procedure?  To lower your risk of infection: ? Your health care team will wash or sanitize their hands. ? Your skin will be washed with soap.  An IV tube will be inserted into one of your veins. This tube will give you fluids and medicines.  You will be given one or more of the following: ? A medicine to help you relax (sedative). ? A medicine to make you fall asleep (general anesthetic).  A water-filled cushion may be placed behind your kidney or on your abdomen. In some cases you may be placed in a tub of lukewarm water.  Your body will be positioned in a way that makes it easy to target the kidney stone.  A flexible tube with holes in it (stent) may be placed in the ureter. This will help keep urine flowing from the kidney if the fragments of the stone have been blocking the ureter.  An X-ray or ultrasound exam will be done to locate your stone.  Shock waves will be aimed at the stone. If you are awake, you may feel a tapping sensation as the shock waves pass through your body. The procedure may vary among health care providers and hospitals. What happens after the procedure?  You may have an X-ray to see whether the procedure was able to break up the kidney stone and how much of the stone has passed. If large stone fragments remain after treatment, you may need to have a second procedure at a later time.  Your blood pressure, heart rate, breathing rate, and blood oxygen level will be monitored until the medicines you were given have worn off.  You may be given antibiotics or pain medicine as needed.  If a stent was placed in your ureter during surgery, it may stay in place for a few weeks.  You may need strain your urine to collect pieces of the kidney stone for testing.  You will need to drink plenty of water.  Do not drive for 24  hours if you were given a sedative. Summary  Lithotripsy is a treatment that can sometimes help eliminate kidney stones and the pain that they cause.  A form of lithotripsy, also known as extracorporeal shock wave lithotripsy, is a nonsurgical procedure that crushes a kidney stone with shock waves.  Generally, this is a safe procedure. However, problems may occur, including damage to the kidney or other organs, infection, or obstruction of the tube that carries urine from the kidney to the bladder (ureter).  When you go home, you will need to drink plenty of water. You may be asked to strain your urine to collect pieces of the kidney stone for testing. This information is not intended to replace advice given to you by  your health care provider. Make sure you discuss any questions you have with your health care provider. Document Released: 11/08/2000 Document Revised: 10/02/2016 Document Reviewed: 10/02/2016 Elsevier Interactive Patient Education  [employer].

## 2018-09-15 NOTE — Telephone Encounter (Signed)
Please call the patient and let her know that I reviewed her MRI in anticipation of our visit this afternoon.  The lesion in question in fact is a renal cyst which is benign, noncancerous, and does not require any further evaluation or surveillance.  She does not need to come to her follow-up appointment this afternoon unless she has questions or concerns.  Vanna Scotland, MD

## 2018-09-16 ENCOUNTER — Telehealth: Payer: Self-pay | Admitting: Obstetrics & Gynecology

## 2018-09-16 NOTE — Telephone Encounter (Signed)
[facility] referring for vaginal bleeding. Called and left voicemail for patient to call back to be schedule

## 2018-09-17 ENCOUNTER — Encounter: Payer: Self-pay | Admitting: Obstetrics & Gynecology

## 2019-01-11 ENCOUNTER — Encounter: Payer: Self-pay | Admitting: Emergency Medicine

## 2019-01-11 ENCOUNTER — Other Ambulatory Visit: Payer: Self-pay

## 2019-01-11 DIAGNOSIS — F17210 Nicotine dependence, cigarettes, uncomplicated: Secondary | ICD-10-CM | POA: Diagnosis not present

## 2019-01-11 DIAGNOSIS — J449 Chronic obstructive pulmonary disease, unspecified: Secondary | ICD-10-CM | POA: Diagnosis not present

## 2019-01-11 DIAGNOSIS — N390 Urinary tract infection, site not specified: Secondary | ICD-10-CM | POA: Diagnosis not present

## 2019-01-11 DIAGNOSIS — R109 Unspecified abdominal pain: Secondary | ICD-10-CM | POA: Diagnosis present

## 2019-01-11 DIAGNOSIS — F419 Anxiety disorder, unspecified: Secondary | ICD-10-CM | POA: Insufficient documentation

## 2019-01-11 DIAGNOSIS — Z79899 Other long term (current) drug therapy: Secondary | ICD-10-CM | POA: Insufficient documentation

## 2019-01-11 DIAGNOSIS — F329 Major depressive disorder, single episode, unspecified: Secondary | ICD-10-CM | POA: Insufficient documentation

## 2019-01-11 DIAGNOSIS — Z5321 Procedure and treatment not carried out due to patient leaving prior to being seen by health care provider: Secondary | ICD-10-CM | POA: Diagnosis not present

## 2019-01-11 DIAGNOSIS — F909 Attention-deficit hyperactivity disorder, unspecified type: Secondary | ICD-10-CM | POA: Insufficient documentation

## 2019-01-11 DIAGNOSIS — Z91040 Latex allergy status: Secondary | ICD-10-CM | POA: Insufficient documentation

## 2019-01-11 LAB — URINALYSIS, COMPLETE (UACMP) WITH MICROSCOPIC
Bilirubin Urine: NEGATIVE
Glucose, UA: NEGATIVE mg/dL
Hgb urine dipstick: NEGATIVE
Ketones, ur: NEGATIVE mg/dL
Nitrite: NEGATIVE
Protein, ur: NEGATIVE mg/dL
Specific Gravity, Urine: 1.024 (ref 1.005–1.030)
pH: 5 (ref 5.0–8.0)

## 2019-01-11 LAB — CBC
HCT: 40.3 % (ref 36.0–46.0)
Hemoglobin: 13.2 g/dL (ref 12.0–15.0)
MCH: 30.6 pg (ref 26.0–34.0)
MCHC: 32.8 g/dL (ref 30.0–36.0)
MCV: 93.5 fL (ref 80.0–100.0)
Platelets: 407 10*3/uL — ABNORMAL HIGH (ref 150–400)
RBC: 4.31 MIL/uL (ref 3.87–5.11)
RDW: 14.7 % (ref 11.5–15.5)
WBC: 11.1 10*3/uL — ABNORMAL HIGH (ref 4.0–10.5)
nRBC: 0 % (ref 0.0–0.2)

## 2019-01-11 LAB — POCT PREGNANCY, URINE: Preg Test, Ur: NEGATIVE

## 2019-01-11 LAB — BASIC METABOLIC PANEL
Anion gap: 6 (ref 5–15)
BUN: 13 mg/dL (ref 6–20)
CO2: 29 mmol/L (ref 22–32)
Calcium: 9.4 mg/dL (ref 8.9–10.3)
Chloride: 107 mmol/L (ref 98–111)
Creatinine, Ser: 0.71 mg/dL (ref 0.44–1.00)
GFR calc Af Amer: >60 mL/min (ref >60)
GFR calc non Af Amer: >60 mL/min (ref >60)
Glucose, Bld: 97 mg/dL (ref 70–99)
Potassium: 3.4 mmol/L — ABNORMAL LOW (ref 3.5–5.1)
Sodium: 142 mmol/L (ref 135–145)

## 2019-01-11 NOTE — ED Triage Notes (Signed)
Pt c/o R flank pain, hx of mass on R kidney and a known 75mm kidney stone to R kidney. Pt states taken AZO and Tylenol without relief.

## 2019-01-12 ENCOUNTER — Emergency Department
Admission: EM | Admit: 2019-01-12 | Discharge: 2019-01-12 | Disposition: A | Discharge status: Home / Self Care | Payer: Medicaid Other | Source: Home / Self Care | Attending: Emergency Medicine | Admitting: Emergency Medicine

## 2019-01-12 ENCOUNTER — Emergency Department
Admission: EM | Admit: 2019-01-12 | Discharge: 2019-01-12 | Discharge status: Against Medical Advice | Payer: Medicaid Other | Attending: Emergency Medicine | Admitting: Emergency Medicine

## 2019-01-12 ENCOUNTER — Other Ambulatory Visit: Payer: Self-pay

## 2019-01-12 ENCOUNTER — Telehealth: Payer: Self-pay | Admitting: Emergency Medicine

## 2019-01-12 ENCOUNTER — Emergency Department: Payer: Medicaid Other

## 2019-01-12 ENCOUNTER — Encounter: Payer: Self-pay | Admitting: Emergency Medicine

## 2019-01-12 DIAGNOSIS — F17210 Nicotine dependence, cigarettes, uncomplicated: Secondary | ICD-10-CM | POA: Insufficient documentation

## 2019-01-12 DIAGNOSIS — F329 Major depressive disorder, single episode, unspecified: Secondary | ICD-10-CM

## 2019-01-12 DIAGNOSIS — N390 Urinary tract infection, site not specified: Secondary | ICD-10-CM | POA: Insufficient documentation

## 2019-01-12 DIAGNOSIS — F909 Attention-deficit hyperactivity disorder, unspecified type: Secondary | ICD-10-CM | POA: Insufficient documentation

## 2019-01-12 DIAGNOSIS — F419 Anxiety disorder, unspecified: Secondary | ICD-10-CM

## 2019-01-12 DIAGNOSIS — J449 Chronic obstructive pulmonary disease, unspecified: Secondary | ICD-10-CM | POA: Insufficient documentation

## 2019-01-12 DIAGNOSIS — Z79899 Other long term (current) drug therapy: Secondary | ICD-10-CM

## 2019-01-12 DIAGNOSIS — Z91040 Latex allergy status: Secondary | ICD-10-CM | POA: Insufficient documentation

## 2019-01-12 LAB — CBC WITH DIFFERENTIAL/PLATELET
Abs Immature Granulocytes: 0.03 10*3/uL (ref 0.00–0.07)
Basophils Absolute: 0.1 10*3/uL (ref 0.0–0.1)
Basophils Relative: 1 %
Eosinophils Absolute: 0.1 10*3/uL (ref 0.0–0.5)
Eosinophils Relative: 1 %
HCT: 40.3 % (ref 36.0–46.0)
Hemoglobin: 13.3 g/dL (ref 12.0–15.0)
Immature Granulocytes: 0 %
Lymphocytes Relative: 28 %
Lymphs Abs: 2.9 10*3/uL (ref 0.7–4.0)
MCH: 31.1 pg (ref 26.0–34.0)
MCHC: 33 g/dL (ref 30.0–36.0)
MCV: 94.4 fL (ref 80.0–100.0)
Monocytes Absolute: 0.5 10*3/uL (ref 0.1–1.0)
Monocytes Relative: 5 %
Neutro Abs: 6.5 10*3/uL (ref 1.7–7.7)
Neutrophils Relative %: 65 %
Platelets: 401 10*3/uL — ABNORMAL HIGH (ref 150–400)
RBC: 4.27 MIL/uL (ref 3.87–5.11)
RDW: 14.6 % (ref 11.5–15.5)
WBC: 10.1 10*3/uL (ref 4.0–10.5)
nRBC: 0 % (ref 0.0–0.2)

## 2019-01-12 LAB — COMPREHENSIVE METABOLIC PANEL
ALT: 19 U/L (ref 0–44)
AST: 20 U/L (ref 15–41)
Albumin: 4.6 g/dL (ref 3.5–5.0)
Alkaline Phosphatase: 40 U/L (ref 38–126)
Anion gap: 4 — ABNORMAL LOW (ref 5–15)
BUN: 10 mg/dL (ref 6–20)
CO2: 29 mmol/L (ref 22–32)
Calcium: 9.5 mg/dL (ref 8.9–10.3)
Chloride: 107 mmol/L (ref 98–111)
Creatinine, Ser: 0.56 mg/dL (ref 0.44–1.00)
GFR calc Af Amer: >60 mL/min (ref >60)
GFR calc non Af Amer: >60 mL/min (ref >60)
Glucose, Bld: 100 mg/dL — ABNORMAL HIGH (ref 70–99)
Potassium: 3.5 mmol/L (ref 3.5–5.1)
Sodium: 140 mmol/L (ref 135–145)
Total Bilirubin: 0.8 mg/dL (ref 0.3–1.2)
Total Protein: 8 g/dL (ref 6.5–8.1)

## 2019-01-12 LAB — URINALYSIS, COMPLETE (UACMP) WITH MICROSCOPIC
Bacteria, UA: NONE SEEN
Specific Gravity, Urine: 1.016 (ref 1.005–1.030)

## 2019-01-12 LAB — POCT PREGNANCY, URINE: Preg Test, Ur: NEGATIVE

## 2019-01-12 MED ORDER — CEPHALEXIN 500 MG PO CAPS: 500.0000 mg | ORAL_CAPSULE | Freq: Two times a day (BID) | ORAL | 0 refills | Status: DC

## 2019-01-12 MED ORDER — KETOROLAC TROMETHAMINE 30 MG/ML IJ SOLN
INTRAMUSCULAR | Status: AC
  Filled 2019-01-12: qty 1

## 2019-01-12 MED ORDER — CEFTRIAXONE SODIUM 1 G IJ SOLR: 1.0000 g | Freq: Once | INTRAMUSCULAR | Status: DC

## 2019-01-12 MED ORDER — CEPHALEXIN 500 MG PO CAPS
500.0000 mg | ORAL_CAPSULE | Freq: Once | ORAL | Status: AC
  Administered 2019-01-12: 500 mg via ORAL
  Filled 2019-01-12: qty 1

## 2019-01-12 MED ORDER — KETOROLAC TROMETHAMINE 30 MG/ML IJ SOLN
30.0000 mg | Freq: Once | INTRAMUSCULAR | Status: AC
  Administered 2019-01-12: 30 mg via INTRAMUSCULAR

## 2019-01-12 NOTE — ED Notes (Signed)
Pt states one week prior she became to have symptoms of generalized body aches, chills, sharp left flank pain, vaginal pressure, and pt reports 3 days prior she passed large clot from vagina. Seen here yesterday by triage and left before MD

## 2019-01-12 NOTE — ED Triage Notes (Signed)
Pt arrives with multiple complaints. Pt states one week prior she became to have symptoms of generalized body aches, chills, sharp left flank pain, vaginal pressure, and pt reports 3 days prior she passed large clot from vagina.

## 2019-01-12 NOTE — ED Notes (Signed)
Patient given crackers and water for PO challenge

## 2019-01-12 NOTE — ED Notes (Signed)
Pt taken to CT.

## 2019-01-12 NOTE — ED Provider Notes (Signed)
[facility] Emergency Department Provider Note   ____________________________________________    I have reviewed the triage vital signs and the nursing notes.   HISTORY  Chief Complaint Vaginal Pain and Flank Pain     HPI Amanda [name] is a 38 y.o. female who presents with complaints of bladder pressure, left-sided flank discomfort and back pain.  She is concerned that she may have a urinary tract infection, she does report chills, unclear regarding fevers.  Did take Azo today.  Denies injury to the area.  Is not take any painkillers.  No nausea or vomiting.  Does have a history of kidney stones in the past as well.  No radiation of pain  Past Medical History:  Diagnosis Date  . Anxiety   . COPD (chronic obstructive pulmonary disease) (HCC)   . Fibromyalgia   . Kidney stones   . Renal disorder   . UTI (urinary tract infection)     Patient Active Problem List   Diagnosis Date Noted  . Attention deficit disorder of adult 01/01/2016  . Nephrolithiasis 01/30/2015  . Panic attack 05/30/2014  . Obesity (BMI 30-39.9) 03/28/2014  . Depression 12/21/2013  . Fibromyalgia 06/21/2013  . Hemorrhage of rectum and anus 01/05/2013  . Low back pain 12/22/2012  . 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 CESAREAN SECTION    . DENTAL SURGERY    . EYE SURGERY    . TUBAL LIGATION      Prior to Admission medications   Medication Sig Start Date End Date Taking? Authorizing Provider  albuterol (PROVENTIL HFA;VENTOLIN HFA) 108 (90 Base) MCG/ACT inhaler Inhale 2 puffs into the lungs every 6 (six) hours as needed for wheezing or shortness of breath.    [provider]  ALPRAZolam Prudy Feeler(XANAX) 1 MG tablet Take 1 mg by mouth 3 (three) times daily as needed. For anxiety. 11/08/15   [provider]  amphetamine-dextroamphetamine (ADDERALL) 5 MG tablet Take 5 mg by mouth 2 (two) times daily. 11/08/15    [provider]  aspirin-acetaminophen-caffeine (EXCEDRIN MIGRAINE) [id number] MG tablet Take by mouth.    [provider]  cephALEXin (KEFLEX) 500 MG capsule Take 1 capsule (500 mg total) by mouth 2 (two) times daily. 01/12/19   Jene EveryKinner, Tito Ausmus, MD  Melatonin 1 MG TABS Take by mouth.    [provider]     Allergies Latex  Family History  Problem Relation Age of Onset  . Fibroids Mother   . Kidney Stones Other   . Crohn's disease Sister     Social History Social History   Tobacco Use  . Smoking status: Current Every Day Smoker    Types: Cigarettes  . Smokeless tobacco: Never Used  Substance Use Topics  . Alcohol use: Yes  . Drug use: Never    Review of Systems  Constitutional: No fever Eyes: No visual changes.  ENT: No sore throat. Cardiovascular: Denies chest pain. Respiratory: Denies shortness of breath. Gastrointestinal: As above Genitourinary: As above Musculoskeletal: As above Skin: Negative for rash. Neurological: Negative for headaches or   ____________________________________________   PHYSICAL EXAM:  VITAL SIGNS: ED Triage Vitals  Enc Vitals Group     BP 01/12/19 1540 (!) 140/96     Pulse Rate 01/12/19 1540 (!) 106     Resp 01/12/19 1540 18     Temp 01/12/19 1540 98.3 F (36.8 C)     Temp Source 01/12/19 1540 Oral  SpO2 01/12/19 1540 98 %     Weight 01/12/19 1541 74.8 kg (165 lb)     Height 01/12/19 1541 1.6 m (5\' 3" )     Head Circumference --      Peak Flow --      Pain Score 01/12/19 1541 8     Pain Loc --      Pain Edu? --      Excl. in GC? --     Constitutional: Alert and oriented.  Anxious  Nose: No congestion/rhinnorhea. Mouth/Throat: Mucous membranes are moist.    Cardiovascular: Normal rate, regular rhythm. Grossly normal heart sounds.  Good peripheral circulation. Respiratory: Normal respiratory effort.  No retractions. Lungs CTAB. Gastrointestinal: Soft and nontender. No distention.  No CVA  tenderness.  Musculoskeletal:   Warm and well perfused Neurologic:  Normal speech and language. No gross focal neurologic deficits are appreciated.  Skin:  Skin is warm, dry and intact. No rash noted. Psychiatric: Mood and affect are normal. Speech and behavior are normal.  ____________________________________________   LABS (all labs ordered are listed, but only abnormal results are displayed)  Labs Reviewed  URINALYSIS, COMPLETE (UACMP) WITH MICROSCOPIC - Abnormal; Notable for the following components:      Result Value   Color, Urine ORANGE (*)    Glucose, UA   (*)    Value: TEST NOT REPORTED DUE TO COLOR INTERFERENCE OF URINE PIGMENT   Hgb urine dipstick   (*)    Value: TEST NOT REPORTED DUE TO COLOR INTERFERENCE OF URINE PIGMENT   Bilirubin Urine   (*)    Value: TEST NOT REPORTED DUE TO COLOR INTERFERENCE OF URINE PIGMENT   Ketones, ur   (*)    Value: TEST NOT REPORTED DUE TO COLOR INTERFERENCE OF URINE PIGMENT   Protein, ur   (*)    Value: TEST NOT REPORTED DUE TO COLOR INTERFERENCE OF URINE PIGMENT   Nitrite   (*)    Value: TEST NOT REPORTED DUE TO COLOR INTERFERENCE OF URINE PIGMENT   Leukocytes,Ua   (*)    Value: TEST NOT REPORTED DUE TO COLOR INTERFERENCE OF URINE PIGMENT   All other components within normal limits  CBC WITH DIFFERENTIAL/PLATELET - Abnormal; Notable for the following components:   Platelets 401 (*)    All other components within normal limits  COMPREHENSIVE METABOLIC PANEL - Abnormal; Notable for the following components:   Glucose, Bld 100 (*)    Anion gap 4 (*)    All other components within normal limits  POCT PREGNANCY, URINE  POC URINE PREG, ED   ____________________________________________  EKG  None ____________________________________________  RADIOLOGY  CT renal stone study negative for ureterolithiasis ____________________________________________   PROCEDURES  Procedure(s) performed: No  Procedures   Critical Care  performed: No ____________________________________________   INITIAL IMPRESSION / ASSESSMENT AND PLAN / ED COURSE  Pertinent labs & imaging results that were available during my care of the patient were reviewed by me and considered in my medical decision making (see chart for details).  Patient presents with left-sided back/flank pain gradual onset with dysuria and pressure and frequency as well.  Suspicious for ureterolithiasis, no CVA tenderness, lab work overall reassuring, urinalysis concerning for possible UTI  CT negative for ureterolithiasis, patient treated with IM Toradol with improvement in her pain.  We will give p.o. Keflex for likely urinary tract infection, appropriate for outpatient follow-up.  Return precautions discussed    ____________________________________________   FINAL CLINICAL IMPRESSION(S) / ED DIAGNOSES  Final  diagnoses:  Lower urinary tract infectious disease        Note:  This document was prepared using Dragon voice recognition software and may include unintentional dictation errors.   Jene Every, MD 01/12/19 [phone]

## 2019-01-12 NOTE — Telephone Encounter (Signed)
Called patient due to lwot to inquire about condition and follow up plans. Left message.   

## 2019-02-27 ENCOUNTER — Telehealth: Payer: Medicaid Other | Admitting: Physician Assistant

## 2019-02-27 DIAGNOSIS — N390 Urinary tract infection, site not specified: Secondary | ICD-10-CM

## 2019-02-27 DIAGNOSIS — Z87448 Personal history of other diseases of urinary system: Secondary | ICD-10-CM

## 2019-02-27 DIAGNOSIS — R109 Unspecified abdominal pain: Secondary | ICD-10-CM

## 2019-02-27 NOTE — Progress Notes (Signed)
Based on what you shared with me, I feel your condition warrants further evaluation and I recommend that you be seen for a face to face office visit.  Giving recurrent UTI, recent kidney infection and quick recurrence of symptoms with back pain you need to be seen in person for assessment and so a urine can be collected to see which antibiotics is going to be the most effective in getting rid of this for good.     NOTE: If you entered your credit card information for this eVisit, you will not be charged. You may see a "hold" on your card for the $35 but that hold will drop off and you will not have a charge processed.  If you are having a true medical emergency please call 911.  If you need an urgent face to face visit, [facility] has four urgent care centers for your convenience.    PLEASE NOTE: THE [facility] LOCATIONS AND URGENT CARE CLINICS DO NOT HAVE THE TESTING FOR CORONAVIRUS COVID19 AVAILABLE.  IF YOU FEEL YOU NEED THIS TEST YOU MUST GO TO A TRIAGE LOCATION AT ONE OF THE HOSPITAL EMERGENCY DEPARTMENTS   [email or web address] to reserve your spot online an avoid wait times  [facility] [address], [address] [place], [place] [address] Modified hours of operation: Monday-Friday, 10 AM to 6 PM  Saturday & Sunday 10 AM to 4 PM *Across the street from [employer]  [employer] (New Address!) [address] [place], [place] [address] *Just off [employer], across the road from [place]* Modified hours of operation: Monday-Friday, 10 AM to 5 PM  Closed Saturday & Sunday   The following sites will take your insurance:  . [facility]  [phone] Get Driving Directions Find a Provider at this Location  [address] [place], [place] [address] . 10 am to 8 pm Monday-Friday . 12 pm to 8 pm Saturday-Sunday   . [facility] at [facility] [address]  [phone] Get Driving Directions  Find a Provider at this Location  [address] [place], [place] [address] . 8 am to 8 pm Monday-Friday . 9 am to 6 pm Saturday . 11 am to 6 pm Sunday   . [facility] at [facility]  [phone] Get Driving Directions  [address] [place], [place] [address] . 8 am to 8 pm Monday-Friday . 8 am to 4 pm Saturday-Sunday   Your e-visit answers were reviewed by a board certified advanced clinical practitioner to complete your personal care plan.  Thank you for using e-Visits.

## 2019-09-17 ENCOUNTER — Other Ambulatory Visit
Admission: RE | Admit: 2019-09-17 | Discharge: 2019-09-17 | Disposition: A | Discharge status: Home / Self Care | Payer: Medicaid Other | Source: Ambulatory Visit | Attending: Family Medicine | Admitting: Family Medicine

## 2019-09-17 DIAGNOSIS — J438 Other emphysema: Secondary | ICD-10-CM | POA: Insufficient documentation

## 2019-09-17 DIAGNOSIS — N12 Tubulo-interstitial nephritis, not specified as acute or chronic: Secondary | ICD-10-CM | POA: Diagnosis not present

## 2019-09-17 LAB — CBC WITH DIFFERENTIAL/PLATELET
Abs Immature Granulocytes: 0.03 10*3/uL (ref 0.00–0.07)
Basophils Absolute: 0.1 10*3/uL (ref 0.0–0.1)
Basophils Relative: 1 %
Eosinophils Absolute: 0.1 10*3/uL (ref 0.0–0.5)
Eosinophils Relative: 1 %
HCT: 37.4 % (ref 36.0–46.0)
Hemoglobin: 12.6 g/dL (ref 12.0–15.0)
Immature Granulocytes: 0 %
Lymphocytes Relative: 25 %
Lymphs Abs: 2.6 10*3/uL (ref 0.7–4.0)
MCH: 30.2 pg (ref 26.0–34.0)
MCHC: 33.7 g/dL (ref 30.0–36.0)
MCV: 89.7 fL (ref 80.0–100.0)
Monocytes Absolute: 0.4 10*3/uL (ref 0.1–1.0)
Monocytes Relative: 4 %
Neutro Abs: 7.4 10*3/uL (ref 1.7–7.7)
Neutrophils Relative %: 69 %
Platelets: 862 10*3/uL — ABNORMAL HIGH (ref 150–400)
RBC: 4.17 MIL/uL (ref 3.87–5.11)
RDW: 15.9 % — ABNORMAL HIGH (ref 11.5–15.5)
WBC: 10.7 10*3/uL — ABNORMAL HIGH (ref 4.0–10.5)
nRBC: 0 % (ref 0.0–0.2)

## 2019-09-17 LAB — CREATININE, SERUM
Creatinine, Ser: 0.59 mg/dL (ref 0.44–1.00)
GFR calc Af Amer: >60 mL/min (ref >60)
GFR calc non Af Amer: >60 mL/min (ref >60)

## 2019-09-17 LAB — AST: AST: 30 U/L (ref 15–41)

## 2019-09-17 LAB — BUN: BUN: 16 mg/dL (ref 6–20)

## 2019-09-17 LAB — ALT: ALT: 39 U/L (ref 0–44)

## 2020-04-01 ENCOUNTER — Emergency Department: Payer: Medicaid Other

## 2020-04-01 ENCOUNTER — Emergency Department
Admission: EM | Admit: 2020-04-01 | Discharge: 2020-04-01 | Disposition: A | Discharge status: Home / Self Care | Payer: Medicaid Other | Attending: Emergency Medicine | Admitting: Emergency Medicine

## 2020-04-01 ENCOUNTER — Other Ambulatory Visit: Payer: Self-pay

## 2020-04-01 DIAGNOSIS — F17210 Nicotine dependence, cigarettes, uncomplicated: Secondary | ICD-10-CM | POA: Insufficient documentation

## 2020-04-01 DIAGNOSIS — Z91040 Latex allergy status: Secondary | ICD-10-CM | POA: Diagnosis not present

## 2020-04-01 DIAGNOSIS — R1030 Lower abdominal pain, unspecified: Secondary | ICD-10-CM | POA: Diagnosis not present

## 2020-04-01 DIAGNOSIS — J449 Chronic obstructive pulmonary disease, unspecified: Secondary | ICD-10-CM | POA: Diagnosis not present

## 2020-04-01 DIAGNOSIS — Z79899 Other long term (current) drug therapy: Secondary | ICD-10-CM | POA: Insufficient documentation

## 2020-04-01 DIAGNOSIS — R102 Pelvic and perineal pain: Secondary | ICD-10-CM | POA: Diagnosis not present

## 2020-04-01 LAB — URINALYSIS, COMPLETE (UACMP) WITH MICROSCOPIC
Bilirubin Urine: NEGATIVE
Glucose, UA: NEGATIVE mg/dL
Ketones, ur: NEGATIVE mg/dL
Nitrite: NEGATIVE
Protein, ur: NEGATIVE mg/dL
Specific Gravity, Urine: 1.013 (ref 1.005–1.030)
pH: 7 (ref 5.0–8.0)

## 2020-04-01 LAB — CHLAMYDIA/NGC RT PCR (ARMC ONLY)??????????: Chlamydia Tr: NOT DETECTED

## 2020-04-01 LAB — CBC WITH DIFFERENTIAL/PLATELET
Abs Immature Granulocytes: 0.01 10*3/uL (ref 0.00–0.07)
Basophils Absolute: 0.1 10*3/uL (ref 0.0–0.1)
Basophils Relative: 1 %
Eosinophils Absolute: 0.3 10*3/uL (ref 0.0–0.5)
Eosinophils Relative: 4 %
HCT: 38.9 % (ref 36.0–46.0)
Hemoglobin: 13.1 g/dL (ref 12.0–15.0)
Immature Granulocytes: 0 %
Lymphocytes Relative: 41 %
Lymphs Abs: 3.1 10*3/uL (ref 0.7–4.0)
MCH: 32.1 pg (ref 26.0–34.0)
MCHC: 33.7 g/dL (ref 30.0–36.0)
MCV: 95.3 fL (ref 80.0–100.0)
Monocytes Absolute: 0.5 10*3/uL (ref 0.1–1.0)
Monocytes Relative: 7 %
Neutro Abs: 3.5 10*3/uL (ref 1.7–7.7)
Neutrophils Relative %: 47 %
Platelets: 322 10*3/uL (ref 150–400)
RBC: 4.08 MIL/uL (ref 3.87–5.11)
RDW: 14.3 % (ref 11.5–15.5)
WBC: 7.5 10*3/uL (ref 4.0–10.5)
nRBC: 0 % (ref 0.0–0.2)

## 2020-04-01 LAB — COMPREHENSIVE METABOLIC PANEL
ALT: 28 U/L (ref 0–44)
AST: 28 U/L (ref 15–41)
Albumin: 4.2 g/dL (ref 3.5–5.0)
Alkaline Phosphatase: 41 U/L (ref 38–126)
Anion gap: 9 (ref 5–15)
BUN: 11 mg/dL (ref 6–20)
CO2: 24 mmol/L (ref 22–32)
Calcium: 8.7 mg/dL — ABNORMAL LOW (ref 8.9–10.3)
Chloride: 104 mmol/L (ref 98–111)
Creatinine, Ser: 0.68 mg/dL (ref 0.44–1.00)
GFR calc Af Amer: >60 mL/min (ref >60)
GFR calc non Af Amer: >60 mL/min (ref >60)
Glucose, Bld: 112 mg/dL — ABNORMAL HIGH (ref 70–99)
Potassium: 3.6 mmol/L (ref 3.5–5.1)
Sodium: 137 mmol/L (ref 135–145)
Total Bilirubin: 0.9 mg/dL (ref 0.3–1.2)
Total Protein: 7.5 g/dL (ref 6.5–8.1)

## 2020-04-01 LAB — LACTIC ACID, PLASMA: Lactic Acid, Venous: 0.7 mmol/L (ref 0.5–1.9)

## 2020-04-01 LAB — WET PREP, GENITAL
Clue Cells Wet Prep HPF POC: NONE SEEN
Sperm: NONE SEEN
Trich, Wet Prep: NONE SEEN
Yeast Wet Prep HPF POC: NONE SEEN

## 2020-04-01 LAB — LIPASE, BLOOD: Lipase: 26 U/L (ref 11–51)

## 2020-04-01 LAB — SEDIMENTATION RATE: Sed Rate: 11 mm/hr (ref 0–20)

## 2020-04-01 LAB — CHLAMYDIA/NGC RT PCR (ARMC ONLY): N gonorrhoeae: NOT DETECTED

## 2020-04-01 LAB — POCT PREGNANCY, URINE: Preg Test, Ur: NEGATIVE

## 2020-04-01 MED ORDER — KETOROLAC TROMETHAMINE 30 MG/ML IJ SOLN
15.0000 mg | Freq: Once | INTRAMUSCULAR | Status: AC
  Administered 2020-04-01: 15 mg via INTRAVENOUS
  Filled 2020-04-01: qty 1

## 2020-04-01 MED ORDER — FENTANYL CITRATE (PF) 100 MCG/2ML IJ SOLN
50.0000 ug | Freq: Once | INTRAMUSCULAR | Status: AC
  Administered 2020-04-01: 50 ug via INTRAVENOUS
  Filled 2020-04-01: qty 2

## 2020-04-01 MED ORDER — ACETAMINOPHEN 500 MG PO TABS
1000.0000 mg | ORAL_TABLET | Freq: Once | ORAL | Status: AC
  Administered 2020-04-01: 1000 mg via ORAL
  Filled 2020-04-01: qty 2

## 2020-04-01 NOTE — ED Notes (Signed)
Patient was tearful when told a tampon was not found in her vagina. Patient was concerned about what was causing the pain. Dr. Lenard Lance aware. Patient ambulated with a steady gait to the room commode. Menstrual pad provided for patient. Room darkened per patient's request.

## 2020-04-01 NOTE — ED Notes (Signed)
First Nurse Note: Pt has a tampon in her vagina that she is unable to get out. Pt appears uncomfortable at this time.

## 2020-04-01 NOTE — ED Triage Notes (Signed)
Pt arrives POV from home with c/o a tampon lodged inside of her. Pt reports she put a tampon in yesterday morning and thinks she may have put another one in afterwards and then could not find the string to get it out. Pt reports lower abdominal pain 10/10 and headache 10/10 w/ hx of migraines.

## 2020-04-01 NOTE — ED Notes (Signed)
Verbal order given by Dr. Cherylann Banas for CMP, lipase, ESR and lactate, CBC

## 2020-04-01 NOTE — ED Notes (Signed)
Pt presentation discussed with Dr. Marisa Severin, verbal order given for fentanyl IV stat

## 2020-04-01 NOTE — ED Provider Notes (Signed)
[name] Emergency Department Provider Note  Time seen: 4:49 PM  I have reviewed the triage vital signs and the nursing notes.   HISTORY  Chief Complaint Foreign Body in Vagina   HPI Amanda Amanda [name] Amanda [name] is a 39 y.o. female with a past medical history Amanda [name], Amanda Amanda [name] Amanda [name], Amanda Amanda [name] Amanda [name] a tampon and it has not come out.  Patient states she is unable to feel any strings.  Patient states over the past 12 hours or so she has developed lower abdominal pain which she states feels like "contractions."  Denies any dysuria or hematuria.  Denies any fever although states she has felt some chills.  Patient is tearful at times in the emergency department, states she recently went through the loss of her husband approximately 1 month ago.  Patient says she does not want to speak to a psychiatrist however.  States she is seen psychiatry and is on medication.   Past Medical History:  Diagnosis Date  . Amanda [name]   . Amanda Amanda [name] Amanda [name] (chronic obstructive pulmonary disease) (HCC)   . Amanda Amanda [name]   . Kidney stones   . Renal disorder   . UTI (urinary tract infection)     Patient Active Problem List   Diagnosis Date Noted  . Attention deficit disorder of adult 01/01/2016  . Nephrolithiasis 01/30/2015  . Panic attack 05/30/2014  . Obesity (BMI 30-39.9) 03/28/2014  . Depression 12/21/2013  . Amanda Amanda [name] 06/21/2013  . Hemorrhage of rectum and anus 01/05/2013  . Low back pain 12/22/2012  . 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 CESAREAN SECTION    . DENTAL SURGERY    . EYE SURGERY    . TUBAL LIGATION      Prior to Admission medications   Medication Sig Start Date End Date Taking? Authorizing Provider  albuterol (PROVENTIL HFA;VENTOLIN HFA) 108 (90 Base) MCG/ACT inhaler Inhale 2 puffs into  the lungs every 6 (six) hours as needed for wheezing or shortness of breath.    [provider]  ALPRAZolam Prudy Feeler) 1 MG tablet Take 1 mg by mouth 3 (three) times daily as needed. For Amanda [name]. 11/08/15   [provider]  amphetamine-dextroamphetamine (ADDERALL) 5 MG tablet Take 5 mg by mouth 2 (two) times daily. 11/08/15   [provider]  aspirin-acetaminophen-caffeine (EXCEDRIN MIGRAINE) [phone] MG tablet Take by mouth.    [provider]  cephALEXin (KEFLEX) 500 MG capsule Take 1 capsule (500 mg total) by mouth 2 (two) times daily. 01/12/19   Jene Every, MD  Melatonin 1 MG TABS Take by mouth.    [provider]    Allergies  Allergen Reactions  . Latex Itching and Swelling    Family History  Problem Relation Age of Onset  . Fibroids Mother   . Kidney Stones Other   . Crohn's disease Sister     Social History Social History   Tobacco Use  . Smoking status: Current Every Day Smoker    Types: Cigarettes  . Smokeless tobacco: Never Used  Substance Use Topics  . Alcohol use: Yes  . Drug use: Never    Review of Systems Constitutional: Negative for fever. Cardiovascular: Negative for chest pain. Respiratory: Negative for shortness of breath. Gastrointestinal: Positive for lower abdominal discomfort. Genitourinary: Negative for urinary compaints Musculoskeletal: Negative for musculoskeletal complaints Neurological:  Negative for headache All other ROS negative  ____________________________________________   PHYSICAL EXAM:  VITAL SIGNS: ED Triage Vitals  Enc Vitals Group     BP 04/01/20 1425 (!) 119/43     Pulse Rate 04/01/20 1425 80     Resp 04/01/20 1425 18     Temp 04/01/20 1425 (!) 97.5 F (36.4 C)     Temp Source 04/01/20 1425 Oral     SpO2 04/01/20 1425 99 %     Weight 04/01/20 1426 160 lb (72.6 kg)     Height 04/01/20 1426 5\' 3"  (1.6 m)     Head Circumference --      Peak Flow --      Pain Score 04/01/20  1426 10     Pain Loc --      Pain Edu? --      Excl. in [place]? --    Constitutional: Alert and oriented. Well appearing and in no distress. Eyes: Normal exam ENT      Head: Normocephalic and atraumatic.      Mouth/Throat: Mucous membranes are moist. Cardiovascular: Normal rate, regular rhythm.  Respiratory: Normal respiratory effort without tachypnea nor retractions. Breath sounds are clear Gastrointestinal: Soft, minimal suprapubic tenderness. Musculoskeletal: Nontender with normal range of motion in all extremities. Neurologic:  Normal speech and language. No gross focal neurologic deficits  Skin:  Skin is warm, dry and intact.  Psychiatric: Mood and affect are normal.   ____________________________________________   RADIOLOGY  Ultrasound negative  ____________________________________________   INITIAL IMPRESSION / ASSESSMENT AND PLAN / ED COURSE  Pertinent labs & imaging results that were available during my care of the patient were reviewed by me and considered in my medical decision making (see chart for details).   Patient presents to the emergency department for lower abdominal discomfort and a possible retained tampon.  Patient is tearful at times, I spoke to the patient regarding this and the recent loss of her husband.  Patient states she is already seeing psychiatry she is on medication she does not want to talk to a psychiatrist.  Patient has mild suprapubic tenderness on exam, otherwise no acute findings.  Patient's lab work is largely within normal limits.  Urinalysis is pending.  We will perform a pelvic examination and obtain a urine sample.  Patient's pelvic exam is normal mild amount of vaginal bleeding from the cervical os.  No retained tampon.  Was able to view the entire vagina and cervix.  Patient states it feels like there is something in there, however multiple angles obtained and I was able to see the entire vagina with no foreign body identified.  I have sent  swabs as a precaution.  Wet prep is negative.  We will obtain an ultrasound to further evaluate.  Patient's ultrasound is negative.  Gonorrhea chlamydia is negative.  Wet prep is negative.  Overall negative work-up.  We will discharge with PCP follow-up.  Patient agreeable to plan of care.  GRACELYNNE Amanda [name] was evaluated in Emergency Department on 04/01/2020 for the symptoms described in the history of present illness. She was evaluated in the context of the global COVID-19 pandemic, which necessitated consideration that the patient might be at risk for infection with the SARS-CoV-2 virus that causes COVID-19. Institutional protocols and algorithms that pertain to the evaluation of patients at risk for COVID-19 are in a state of rapid change based on information released by regulatory bodies including the CDC and federal and state organizations. These policies and algorithms  were followed during the patient's care in the ED.  ____________________________________________   FINAL CLINICAL IMPRESSION(S) / ED DIAGNOSES  Lower abdominal pain/pelvic pain   Minna Antis, MD 04/01/20 2019

## 2020-09-19 ENCOUNTER — Other Ambulatory Visit: Payer: Self-pay | Admitting: General Surgery

## 2020-09-21 LAB — SURGICAL PATHOLOGY

## 2020-10-21 ENCOUNTER — Encounter: Payer: Self-pay | Admitting: Emergency Medicine

## 2020-10-21 ENCOUNTER — Other Ambulatory Visit: Payer: Self-pay

## 2020-10-21 ENCOUNTER — Emergency Department
Admission: EM | Admit: 2020-10-21 | Discharge: 2020-10-21 | Disposition: A | Discharge status: Home / Self Care | Payer: Medicaid Other | Attending: Emergency Medicine | Admitting: Emergency Medicine

## 2020-10-21 DIAGNOSIS — Z79899 Other long term (current) drug therapy: Secondary | ICD-10-CM | POA: Insufficient documentation

## 2020-10-21 DIAGNOSIS — N760 Acute vaginitis: Secondary | ICD-10-CM | POA: Diagnosis not present

## 2020-10-21 DIAGNOSIS — S3141XA Laceration without foreign body of vagina and vulva, initial encounter: Secondary | ICD-10-CM | POA: Insufficient documentation

## 2020-10-21 DIAGNOSIS — W312XXA Contact with powered woodworking and forming machines, initial encounter: Secondary | ICD-10-CM | POA: Insufficient documentation

## 2020-10-21 DIAGNOSIS — Z91040 Latex allergy status: Secondary | ICD-10-CM | POA: Diagnosis not present

## 2020-10-21 DIAGNOSIS — J449 Chronic obstructive pulmonary disease, unspecified: Secondary | ICD-10-CM | POA: Diagnosis not present

## 2020-10-21 DIAGNOSIS — F17210 Nicotine dependence, cigarettes, uncomplicated: Secondary | ICD-10-CM | POA: Insufficient documentation

## 2020-10-21 LAB — URINALYSIS, COMPLETE (UACMP) WITH MICROSCOPIC
Bilirubin Urine: NEGATIVE
Glucose, UA: NEGATIVE mg/dL
Ketones, ur: NEGATIVE mg/dL
Leukocytes,Ua: NEGATIVE
Nitrite: NEGATIVE
Protein, ur: NEGATIVE mg/dL
Specific Gravity, Urine: 1.005 (ref 1.005–1.030)
pH: 7 (ref 5.0–8.0)

## 2020-10-21 LAB — WET PREP, GENITAL
Sperm: NONE SEEN
Trich, Wet Prep: NONE SEEN
Yeast Wet Prep HPF POC: NONE SEEN

## 2020-10-21 LAB — POC URINE PREG, ED: Preg Test, Ur: NEGATIVE

## 2020-10-21 LAB — CHLAMYDIA/NGC RT PCR (ARMC ONLY)
Chlamydia Tr: NOT DETECTED
N gonorrhoeae: NOT DETECTED

## 2020-10-21 MED ORDER — METRONIDAZOLE 500 MG PO TABS
500.0000 mg | ORAL_TABLET | Freq: Two times a day (BID) | ORAL | 0 refills | Status: AC
Start: 2020-10-21 — End: 2020-10-28

## 2020-10-21 MED ORDER — SULFAMETHOXAZOLE-TRIMETHOPRIM 800-160 MG PO TABS
1.0000 | ORAL_TABLET | Freq: Two times a day (BID) | ORAL | 0 refills | Status: DC
Start: 2020-10-21 — End: 2021-03-29

## 2020-10-21 NOTE — ED Provider Notes (Signed)
[facility] Emergency Department Provider Note  ____________________________________________  Time seen: Approximately 6:35 PM  I have reviewed the triage vital signs and the nursing notes.   HISTORY  Chief Complaint Laceration    HPI Amanda [name] is a 39 y.o. female that presents to the emergency department for evaluation of small laceration to left labia minora 2 days ago.  Patient states that one of her children used her razor 2 days ago.  She decided to use a small clippers to shave instead.  She nicked her labia minora in the process.  It did bleed a bit following.  That night, patient had unprotected intercourse with a new partner.  Patient is allergic to latex and they did briefly use a latex condom.  She developed vaginal pain and swelling the following day.  Pain is primarily to her labia minora where she sustained the injury.  She is due to start her menstrual cycle and just started spotting.  She has never had genital herpes.  No fevers, abdominal pain, vaginal discharge.  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 Authorizing Provider  ADDERALL XR 30 MG 24 hr capsule Take 30 mg by mouth daily. 02/29/20   [provider]   ALPRAZolam Prudy Feeler) 1 MG tablet Take 1 mg by mouth 3 (three) times daily as needed for anxiety.     [provider]  amphetamine-dextroamphetamine (ADDERALL) 5 MG tablet Take 5 mg by mouth daily.     [provider]  aspirin-acetaminophen-caffeine (EXCEDRIN MIGRAINE) [phone] MG tablet Take 1-2 tablets by mouth every 6 (six) hours as needed for headache.     [provider]  buPROPion (WELLBUTRIN XL) 150 MG 24 hr tablet Take 150 mg by mouth daily. 01/28/20   [provider]  metroNIDAZOLE (FLAGYL) 500 MG tablet Take 1 tablet (500 mg total) by mouth 2 (two) times daily for 7 days. 10/21/20 10/28/20  Enid Derry, PA-C  sulfamethoxazole-trimethoprim (BACTRIM DS) 800-160 MG tablet Take 1 tablet by mouth 2 (two) times daily. 10/21/20   Enid Derry, PA-C    Allergies Latex  Family History  Problem Relation Age of Onset   Fibroids Mother    Kidney Stones Other    Crohn's disease Sister     Social History Social History   Tobacco Use   Smoking status: Current Every Day Smoker    Types: Cigarettes   Smokeless tobacco: Never Used  Vaping Use   Vaping Use: Never used  Substance Use Topics   Alcohol use: Yes   Drug use: Never     Review of Systems  Cardiovascular: No chest pain. Respiratory: No SOB. Gastrointestinal: No  abdominal pain.  No nausea, no vomiting.  Genitourinary: Negative for dysuria. Musculoskeletal: Negative for musculoskeletal pain. Skin: Negative for rash, abrasions, lacerations, ecchymosis. Neurological: Negative for headaches   ____________________________________________   PHYSICAL EXAM:  VITAL SIGNS: ED Triage Vitals  Enc Vitals Group     BP 10/21/20 1627 (!) 157/89     Pulse Rate 10/21/20 1627 (!) 107     Resp 10/21/20 1627 20     Temp 10/21/20 1629 98 F (36.7 C)     Temp Source 10/21/20 1629 Oral     SpO2 10/21/20 1627 98 %     Weight 10/21/20 1628 190 lb (86.2 kg)     Height 10/21/20 1628 5\' 3"   (1.6 m)     Head Circumference --      Peak Flow --      Pain Score 10/21/20 1628 10     Pain Loc --      Pain Edu? --      Excl. in [place]? --      Constitutional: Alert and oriented. Well appearing and in no acute distress. Eyes: Conjunctivae are normal. PERRL. EOMI. Head: Atraumatic. ENT:      Ears:      Nose: No congestion/rhinnorhea.      Mouth/Throat: Mucous membranes are moist.  Neck: No stridor. Cardiovascular: Normal rate, regular rhythm.  Good peripheral circulation. Respiratory: Normal respiratory effort without tachypnea or retractions. Lungs CTAB. Good air entry to the bases with no decreased or absent breath sounds. Gastrointestinal: Bowel sounds 4 quadrants. Soft and nontender to palpation. No guarding or rigidity. No palpable masses. No distention. Pelvic: Small abrasion to mid left labia minora.  No vesicles seen.  Thin white vaginal discharge in canal.  Mild vaginal spotting. Musculoskeletal: Full range of motion to all extremities. No gross deformities appreciated. Neurologic:  Normal speech and language. No gross focal neurologic deficits are appreciated.  Skin:  Skin is warm, dry and intact. No rash noted. Psychiatric: Mood and affect are normal. Speech and behavior are normal. Patient exhibits appropriate insight and judgement.   ____________________________________________   LABS (all labs ordered are listed, but only abnormal results are displayed)  Labs Reviewed  WET PREP, GENITAL - Abnormal; Notable for the following components:      Result Value   Clue Cells Wet Prep HPF POC PRESENT (*)    WBC, Wet Prep HPF POC FEW (*)    All other components within normal limits  URINALYSIS, COMPLETE (UACMP) WITH MICROSCOPIC - Abnormal; Notable for the following components:   Color, Urine STRAW (*)    APPearance CLEAR (*)    Hgb urine dipstick MODERATE (*)    Bacteria, UA RARE (*)    All other components within normal limits  CHLAMYDIA/NGC RT PCR ([facility]  ONLY)  POC URINE PREG, ED   ____________________________________________  EKG   ____________________________________________  RADIOLOGY  No results found.  ____________________________________________    PROCEDURES  Procedure(s) performed:    Procedures    Medications - No data to display   ____________________________________________   INITIAL IMPRESSION / ASSESSMENT AND PLAN / ED COURSE  Pertinent labs & imaging results that were available during my care of the patient were reviewed by me and considered in my medical decision making (see chart for details).  Review of the [place] CSRS was performed in accordance of the [facility] prior to dispensing any controlled drugs.     Patient presented to emergency department for evaluation of injury to the labia minora 2 days  ago.  Vital signs and exam are reassuring.  Patient does have a small cut to her labia minora with surrounding swelling.  No abscess.  Patient will be started on Bactrim for possible infection.  There are clue cells on her wet prep.  She will be started on Flagyl for bacterial vaginosis.  Gonorrhea and Chlamydia testsnegative.  Patient will be discharged home with prescriptions for Flagyl and Bactrim. Patient is to follow up with PCP as directed. Patient is given ED precautions to return to the ED for any worsening or new symptoms.  Amanda [name] was evaluated in Emergency Department on 10/21/2020 for the symptoms described in the history of present illness. She was evaluated in the context of the global COVID-19 pandemic, which necessitated consideration that the patient might be at risk for infection with the SARS-CoV-2 virus that causes COVID-19. Institutional protocols and algorithms that pertain to the evaluation of patients at risk for COVID-19 are in a state of rapid change based on information released by regulatory bodies including the CDC and federal and state organizations. These policies and  algorithms were followed during the patient's care in the ED.   ____________________________________________  FINAL CLINICAL IMPRESSION(S) / ED DIAGNOSES  Final diagnoses:  Laceration of labia minora, initial encounter  Bacterial vaginosis      NEW MEDICATIONS STARTED DURING THIS VISIT:  ED Discharge Orders         Ordered    sulfamethoxazole-trimethoprim (BACTRIM DS) 800-160 MG tablet  2 times daily        10/21/20 1827    metroNIDAZOLE (FLAGYL) 500 MG tablet  2 times daily        10/21/20 1827              This chart was dictated using voice recognition software/Dragon. Despite best efforts to proofread, errors can occur which can change the meaning. Any change was purely unintentional.    Enid Derry, PA-C 10/21/20 1929    Phineas Semen, MD 10/21/20 [employer]

## 2020-10-21 NOTE — ED Triage Notes (Signed)
Pt reports 2 days ago was going to shave her labia and she was upset that someone had used her razor so instead of using her razor she used a different kind of clippers and cut her labia. Pt reports she thought it would get better but it is now swollen and more painful.

## 2021-01-04 IMAGING — CT CT RENAL STONE PROTOCOL
3 of 4 series · 9 of 46 positions shown, 16 images · non-contrast
Comparison: Prior CT of the abdomen and pelvis without contrast on
06/10/2018

CLINICAL DATA: Generalized body aches, chills and left flank pain.

EXAM:
CT ABDOMEN AND PELVIS WITHOUT CONTRAST
TECHNIQUE: Multidetector CT imaging of the abdomen and pelvis was performed
following the standard protocol without IV contrast.

[Series 4: lung bases · axial · 0.75mm/px · z∈[-176,-106]mm · 5 of 22 slices shown, 10 images]
[im 4/22  soft-tissue]
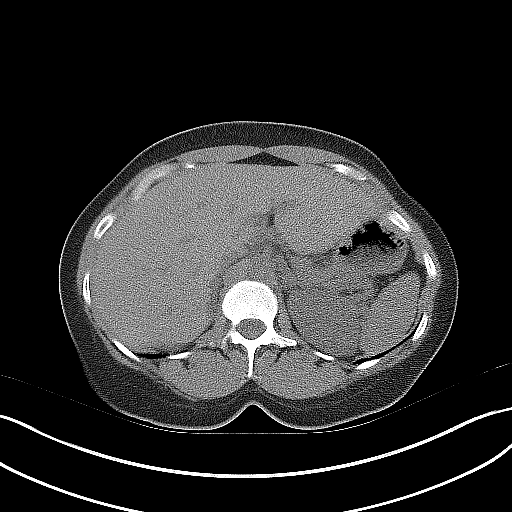
[im 4/22  bone]
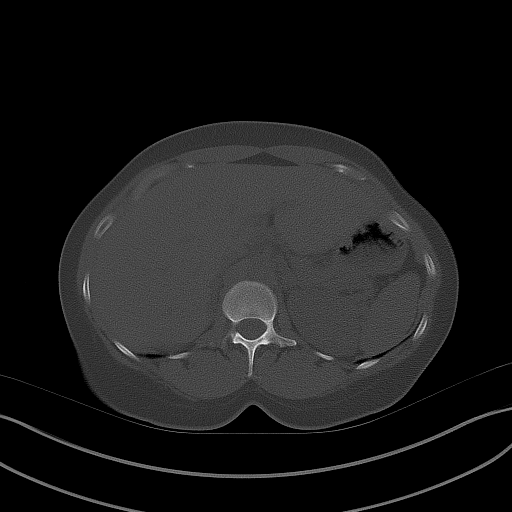
[im 8/22  soft-tissue]
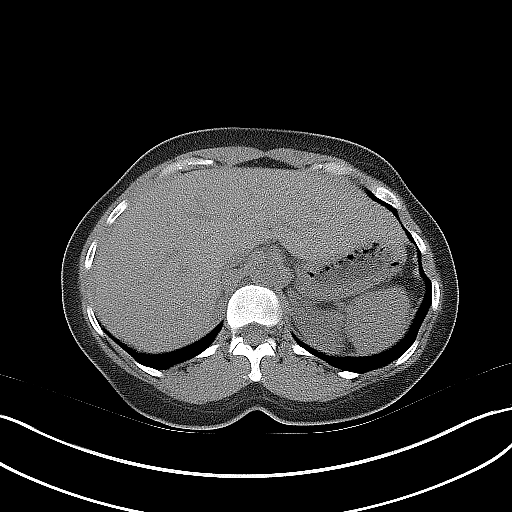
[im 8/22  lung]
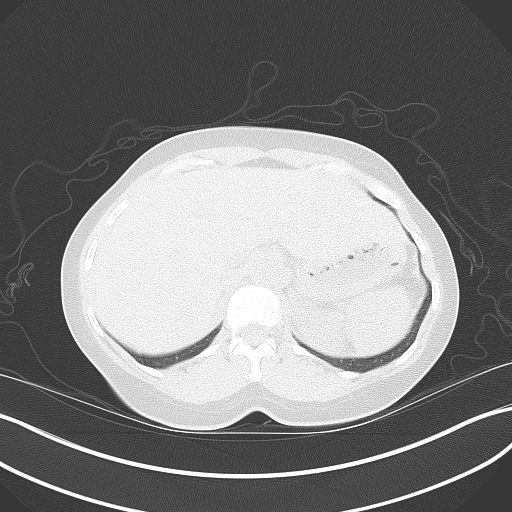
[im 11/22  soft-tissue]
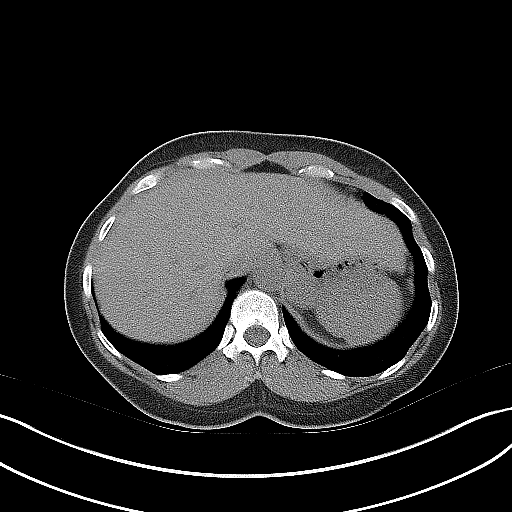
[im 11/22  lung]
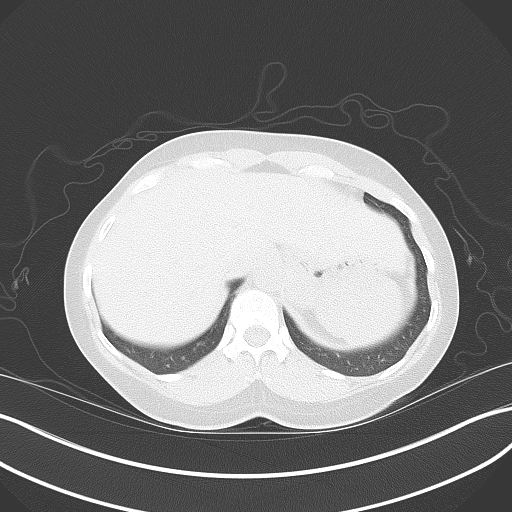
[im 15/22  soft-tissue]
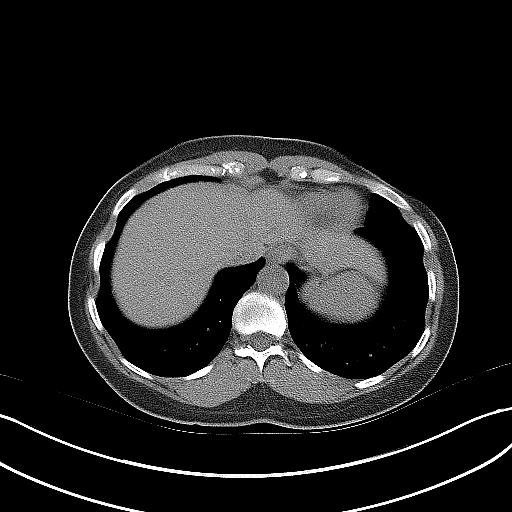
[im 15/22  lung]
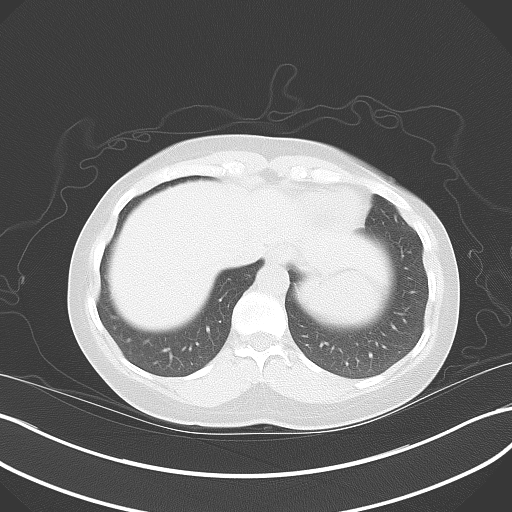
[im 18/22  soft-tissue]
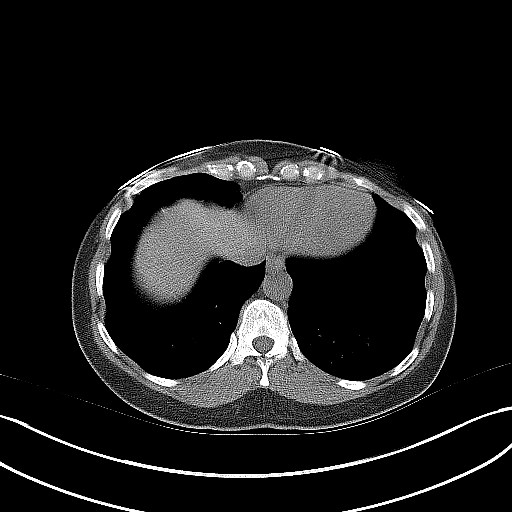
[im 18/22  lung]
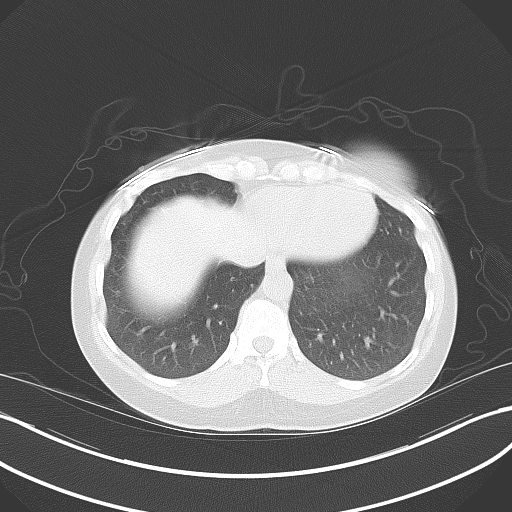

[Series 5: coronal · coronal · 0.83mm/px · 3 of 109 slices shown, 4 images]
[im 37/109  soft-tissue]
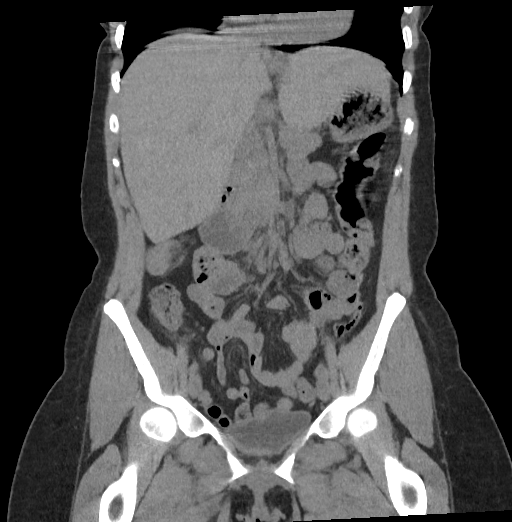
[im 49/109  soft-tissue]
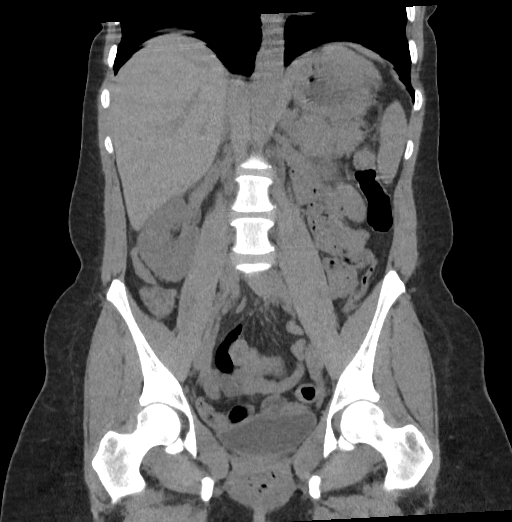
[im 49/109  bone]
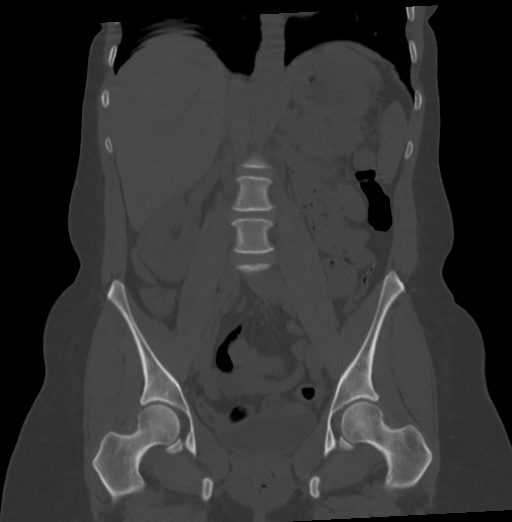
[im 61/109  soft-tissue]
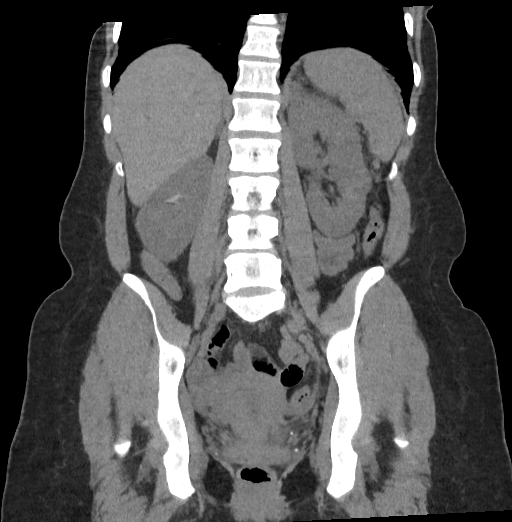

[Series 6: sagittal · sagittal · 0.49mm/px · 1 of 163 slices shown, 2 images]
[im 55/163  soft-tissue]
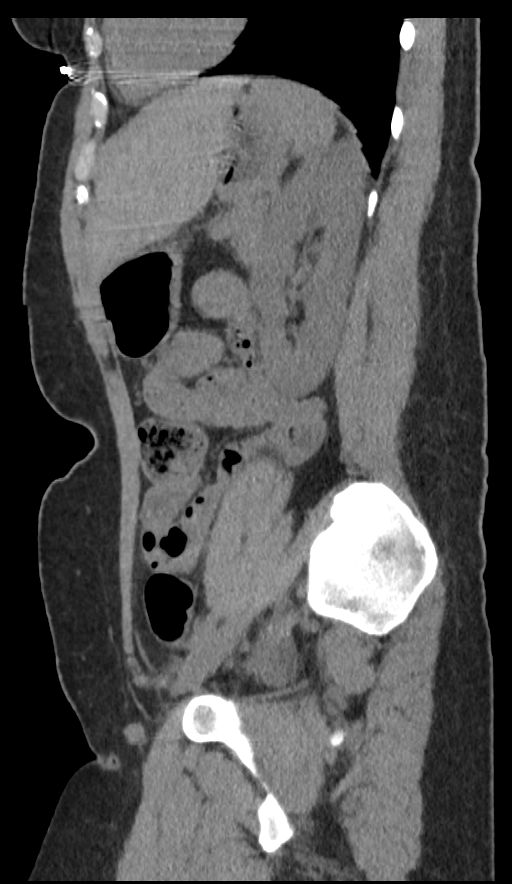
[im 55/163  bone]
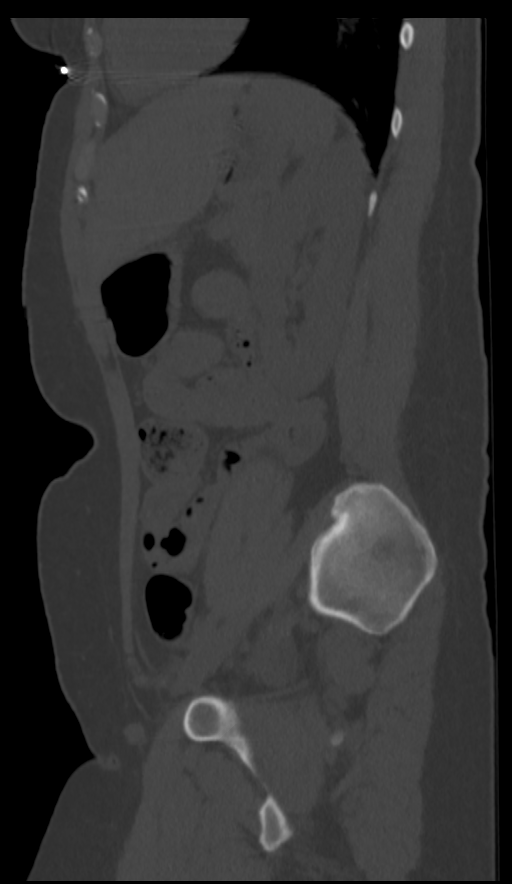

[9 of 46 positions shown; findings below may reference images not displayed]

FINDINGS: Lower chest: No acute abnormality.

Hepatobiliary: No focal liver abnormality is seen. The gallbladder
is contracted. No dilated bile ducts identified.

Pancreas: Unremarkable. No pancreatic ductal dilatation or
surrounding inflammatory changes.

Spleen: Normal in size without focal abnormality.

Adrenals/Urinary Tract: Unremarkable adrenal glands. Stable calculus
in the upper right kidney measuring approximately 11 mm in greatest
diameter. No evidence of hydronephrosis, ureteral calculi or bladder
calculi. The bladder appears unremarkable.

Stomach/Bowel: Bowel shows no evidence of obstruction, ileus or
inflammation. No free air identified.

Vascular/Lymphatic: No significant vascular findings are present. No
enlarged abdominal or pelvic lymph nodes.

Reproductive: Uterus and bilateral adnexa are unremarkable.

Other: No hernias identified. No evidence of ascites or focal
abscess.

Musculoskeletal: No acute or significant osseous findings.
IMPRESSION: Stable nonobstructing calculus in the upper right kidney measuring
approximately 11 mm in greatest diameter.

## 2021-03-29 ENCOUNTER — Encounter: Payer: Self-pay | Admitting: Advanced Practice Midwife

## 2021-03-29 ENCOUNTER — Ambulatory Visit (INDEPENDENT_AMBULATORY_CARE_PROVIDER_SITE_OTHER): Payer: Medicaid Other | Admitting: Advanced Practice Midwife

## 2021-03-29 ENCOUNTER — Other Ambulatory Visit: Payer: Self-pay

## 2021-03-29 ENCOUNTER — Other Ambulatory Visit (HOSPITAL_COMMUNITY)
Admission: RE | Admit: 2021-03-29 | Discharge: 2021-03-29 | Disposition: A | Discharge status: Home / Self Care | Payer: Medicaid Other | Source: Ambulatory Visit | Attending: Advanced Practice Midwife | Admitting: Advanced Practice Midwife

## 2021-03-29 VITALS — BP 138/86 | Ht 63.0 in | Wt 217.0 lb

## 2021-03-29 DIAGNOSIS — B379 Candidiasis, unspecified: Secondary | ICD-10-CM

## 2021-03-29 DIAGNOSIS — Z01419 Encounter for gynecological examination (general) (routine) without abnormal findings: Secondary | ICD-10-CM | POA: Diagnosis not present

## 2021-03-29 DIAGNOSIS — Z0000 Encounter for general adult medical examination without abnormal findings: Secondary | ICD-10-CM

## 2021-03-29 DIAGNOSIS — Z113 Encounter for screening for infections with a predominantly sexual mode of transmission: Secondary | ICD-10-CM | POA: Insufficient documentation

## 2021-03-29 DIAGNOSIS — Z124 Encounter for screening for malignant neoplasm of cervix: Secondary | ICD-10-CM | POA: Insufficient documentation

## 2021-03-29 DIAGNOSIS — Z1159 Encounter for screening for other viral diseases: Secondary | ICD-10-CM | POA: Diagnosis not present

## 2021-03-29 DIAGNOSIS — N898 Other specified noninflammatory disorders of vagina: Secondary | ICD-10-CM

## 2021-03-29 DIAGNOSIS — B9689 Other specified bacterial agents as the cause of diseases classified elsewhere: Secondary | ICD-10-CM

## 2021-03-29 DIAGNOSIS — B009 Herpesviral infection, unspecified: Secondary | ICD-10-CM

## 2021-03-29 DIAGNOSIS — Z1239 Encounter for other screening for malignant neoplasm of breast: Secondary | ICD-10-CM

## 2021-03-29 MED ORDER — VALACYCLOVIR HCL 500 MG PO TABS
500.0000 mg | ORAL_TABLET | Freq: Every day | ORAL | 11 refills | Status: DC
Start: 2021-03-29 — End: 2024-09-17

## 2021-03-30 LAB — HEPATITIS C ANTIBODY: Hep C Virus Ab: <0.1 s/co ratio (ref 0.0–0.9)

## 2021-03-30 LAB — CERVICOVAGINAL ANCILLARY ONLY
Bacterial Vaginitis (gardnerella): POSITIVE — AB
Candida Glabrata: NEGATIVE
Candida Vaginitis: NEGATIVE
Comment: NEGATIVE
Comment: NEGATIVE
Comment: NEGATIVE

## 2021-03-30 LAB — HEPATITIS B SURFACE ANTIBODY,QUALITATIVE: Hep B Surface Ab, Qual: NONREACTIVE

## 2021-03-30 LAB — RPR QUALITATIVE: RPR Ser Ql: NONREACTIVE

## 2021-03-30 LAB — HIV ANTIBODY (ROUTINE TESTING W REFLEX): HIV Screen 4th Generation wRfx: NONREACTIVE

## 2021-03-31 ENCOUNTER — Encounter: Payer: Self-pay | Admitting: Advanced Practice Midwife

## 2021-03-31 MED ORDER — METRONIDAZOLE 500 MG PO TABS: 500.0000 mg | ORAL_TABLET | Freq: Two times a day (BID) | ORAL | 0 refills | Status: AC

## 2021-03-31 MED ORDER — FLUCONAZOLE 150 MG PO TABS: 150.0000 mg | ORAL_TABLET | Freq: Once | ORAL | 3 refills | Status: AC

## 2021-03-31 NOTE — Progress Notes (Signed)
Gynecology Annual Exam   Date of Service: 03/29/2021  PCP: Grayland Jack, FNP  Chief Complaint:  Chief Complaint  Patient presents with  . Gynecologic Exam    Annual - desires STI testing all, per patient new sexual partner. Painful blisters on labia. RM 5    History of Present Illness: Patient is a 40 y.o. G2P2002 presents for annual exam. The patient has complaint today of painful bumps near her left labia. She denies any history of HSV. She has a new partner and has been having vaginal area pain since intercourse a few days ago. 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 She denies knowledge of the lab result.  It has been a number of years since her last gyn exam and she would like to have routine and STD screening today. She will be 40 in October of this year. She accepts a referral for screening mammogram to be scheduled around the time of her birthday.   LMP: Patient's last menstrual period was 03/20/2021. Average Interval: regular, 28 days Duration of flow: 7 days Heavy Menses: yes Clots: no Intermenstrual Bleeding: no Postcoital Bleeding: no Dysmenorrhea: yes  The patient is sexually active. She currently uses tubal ligation for contraception. She denies dyspareunia except as noted in HPI.  The patient does perform self breast exams. There is possibly notable family history of ovarian cancer. Her mother may have had ovarian cancer.  The patient wears seatbelts: yes.   The patient has regular exercise: she has limited exercise although she has recently started bike riding with her daughter, she is trying to eat healthy, she admits adequate hydration and sleep.    The patient denies current symptoms of depression. She reports good control with her medications.  Review of Systems: Review of Systems  Constitutional: Positive for malaise/fatigue. Negative for chills and fever.  HENT: Negative for congestion, ear discharge, ear  pain, hearing loss, sinus pain and sore throat.   Eyes: Negative for blurred vision and double vision.  Respiratory: Negative for cough, shortness of breath and wheezing.   Cardiovascular: Negative for chest pain, palpitations and leg swelling.  Gastrointestinal: Positive for constipation. Negative for abdominal pain, blood in stool, diarrhea, heartburn, melena, nausea and vomiting.  Genitourinary: Negative for dysuria, flank pain, frequency, hematuria and urgency.       Positive for vaginal discharge  Musculoskeletal: Positive for joint pain. Negative for back pain and myalgias.  Skin: Negative for itching and rash.  Neurological: Positive for tingling and headaches. Negative for dizziness, tremors, sensory change, speech change, focal weakness, seizures, loss of consciousness and weakness.  Endo/Heme/Allergies: Positive for environmental allergies. Does not bruise/bleed easily.  Psychiatric/Behavioral: Negative for depression, hallucinations, memory loss, substance abuse and suicidal ideas. The patient is not nervous/anxious and does not have insomnia.        Positive for anxiety and depression  Breast: Positive for tenderness   Past Medical History:  Patient Active Problem List   Diagnosis Date Noted  . Attention deficit disorder of adult 01/01/2016  . Obesity (BMI 30-39.9) 03/28/2014  . Depression 12/21/2013  . Fibromyalgia 06/21/2013  . Hemorrhage of rectum and anus 01/05/2013  . Irritable colon 08/24/2012    Past Surgical History:  Past Surgical History:  Procedure Laterality Date  . CESAREAN SECTION    . DENTAL SURGERY    . EYE SURGERY    . TUBAL LIGATION      Gynecologic History:  Patient's last menstrual period was 03/20/2021. Contraception: tubal ligation Last Pap: 2014 Results were: no abnormalities per patient report  Obstetric History: G2P2002  Family History:  Family History  Problem Relation Age of Onset  . Fibroids Mother   . Kidney Stones Other   .  Crohn's disease Sister     Social History:  Social History   Socioeconomic History  . Marital status: Widowed    Spouse name: Not on file  . Number of children: Not on file  . Years of education: Not on file  . Highest education level: Not on file  Occupational History  . Not on file  Tobacco Use  . Smoking status: Current Every Day Smoker    Types: Cigarettes  . Smokeless tobacco: Never Used  Vaping Use  . Vaping Use: Never used  Substance and Sexual Activity  . Alcohol use: Yes    Comment: occ  . Drug use: Yes    Types: Marijuana    Comment: occ  . Sexual activity: Yes    Birth control/protection: Surgical    Comment: Tubal  Other Topics Concern  . Not on file  Social History Narrative  . Not on file   Social Determinants of Health   Financial Resource Strain: Not on file  Food Insecurity: Not on file  Transportation Needs: Not on file  Physical Activity: Not on file  Stress: Not on file  Social Connections: Not on file  Intimate Partner Violence: Not on file    Allergies:  Allergies  Allergen Reactions  . Cephalexin Hives, Itching and Swelling    Short of breath Eye swelling, panic attacks Swelling in throat.   . Macrolides And Ketolides Anaphylaxis    Not sure which antibiotic  . Latex Itching and Swelling    Medications: Prior to Admission medications   Medication Sig Start Date End Date Taking? Authorizing Provider  ADDERALL XR 30 MG 24 hr capsule Take 30 mg by mouth daily. 02/29/20  Yes [provider]  ALPRAZolam Prudy Feeler) 1 MG tablet Take 1 mg by mouth 3 (three) times daily as needed for anxiety.    Yes [provider]  amphetamine-dextroamphetamine (ADDERALL) 5 MG tablet Take 5 mg by mouth daily.    Yes [provider]  buPROPion (WELLBUTRIN XL) 150 MG 24 hr tablet Take 150 mg by mouth daily. 01/28/20  Yes [provider]  cyclobenzaprine (FLEXERIL) 10 MG tablet Take by mouth.   Yes [provider]   levothyroxine (SYNTHROID) 75 MCG tablet Take 75 mcg by mouth daily. 01/22/21  Yes [provider]  nystatin ointment (MYCOSTATIN) APPLY TO AFFECTED AREA TWICE A DAY AS NEEDED 01/07/21  Yes [provider]  oxybutynin (DITROPAN) 5 MG tablet Take by mouth. 09/15/20  Yes [provider]  valACYclovir (VALTREX) 500 MG tablet Take 1 tablet (500 mg total) by mouth daily. 03/29/21  Yes Tresea Mall, CNM  aspirin-acetaminophen-caffeine (EXCEDRIN MIGRAINE) [id number] MG tablet Take 1-2 tablets by mouth every 6 (six) hours as needed for headache.     [provider]    Physical Exam Vitals: Blood pressure 138/86, height 5\' 3"  (1.6 m), weight 217 lb (98.4 kg), last menstrual period 03/20/2021.  General: NAD HEENT: normocephalic, anicteric Thyroid: no enlargement, no palpable nodules Pulmonary: No increased work of breathing, CTAB Cardiovascular: RRR, distal pulses 2+ Breast: Breast symmetrical, no tenderness, no palpable nodules or masses, no skin or nipple retraction present, no nipple discharge.  No axillary or supraclavicular lymphadenopathy. Abdomen: NABS, soft,  non-tender, non-distended.  Umbilicus without lesions.  No hepatomegaly, splenomegaly or masses palpable. No evidence of hernia  Genitourinary:  External: Normal external female genitalia.  Normal urethral meatus, normal Bartholin's and Skene's glands.  Cluster of open blisters on lower left vulva, painful to touch. One lesion on right side in healing stage. She admits, at time of exam, previously having that bump.  Vagina: Normal vaginal mucosa, no evidence of prolapse.    Cervix: Grossly normal in appearance, no bleeding, no CMT  Uterus: Non-enlarged, mobile, normal contour.    Adnexa: ovaries non-enlarged, no adnexal masses  Rectal: deferred  Lymphatic: no evidence of inguinal lymphadenopathy Extremities: no edema, erythema, or tenderness Neurologic: Grossly intact Psychiatric: mood appropriate,  affect full   Assessment: 40 y.o. G2P2002 routine annual exam  Plan: Problem List Items Addressed This Visit   None   Visit Diagnoses    Well woman exam with routine gynecological exam    -  Primary   Relevant Orders   Herpes simplex virus culture   Cytology - PAP   Cervicovaginal ancillary only (Completed)   Hepatitis C Antibody (Completed)   Hepatitis B Surface AntiBODY (Completed)   RPR Qual (Completed)   HIV Antibody (routine testing w rflx) (Completed)   MM DIGITAL SCREENING BILATERAL   Screen for sexually transmitted diseases       Relevant Orders   Herpes simplex virus culture   Cytology - PAP   Hepatitis C Antibody (Completed)   Hepatitis B Surface AntiBODY (Completed)   RPR Qual (Completed)   HIV Antibody (routine testing w rflx) (Completed)   Screening for cervical cancer       Relevant Orders   Cytology - PAP   Need for hepatitis B screening test       Relevant Orders   Hepatitis B Surface AntiBODY (Completed)   Need for hepatitis C screening test       Relevant Orders   Hepatitis C Antibody (Completed)   Vaginal irritation       Relevant Orders   Cervicovaginal ancillary only (Completed)   HSV infection       Relevant Medications   nystatin ointment (MYCOSTATIN)   valACYclovir (VALTREX) 500 MG tablet   metroNIDAZOLE (FLAGYL) 500 MG tablet   fluconazole (DIFLUCAN) 150 MG tablet   Breast screening       Relevant Orders   MM DIGITAL SCREENING BILATERAL   Yeast infection       Relevant Medications   nystatin ointment (MYCOSTATIN)   valACYclovir (VALTREX) 500 MG tablet   metroNIDAZOLE (FLAGYL) 500 MG tablet   fluconazole (DIFLUCAN) 150 MG tablet   BV (bacterial vaginosis)       Relevant Medications   nystatin ointment (MYCOSTATIN)   valACYclovir (VALTREX) 500 MG tablet   metroNIDAZOLE (FLAGYL) 500 MG tablet   fluconazole (DIFLUCAN) 150 MG tablet      1) STI screening  was offered and accepted  2)  ASCCP guidelines and rationale discussed.   Patient opts for every 3 years screening interval  3) Contraception - the patient is currently using  tubal ligation.  She is happy with her current form of contraception and plans to continue  4) Routine healthcare maintenance including cholesterol, diabetes screening discussed managed by PCP   5) Rx valtrex for HSV outbreak  6) Update to visit (03/31/21 lab results +BV) Rx's sent (BV/yeast)  7) Follow up as needed after remaining labs result  8) Return in about 1 year (around 03/29/2022) for annual established gyn.  Tresea MallJane Bryelle [name], CNM [facility] 03/31/2021, 11:39 AM

## 2021-04-02 LAB — CYTOLOGY - PAP
Chlamydia: NEGATIVE
Comment: NEGATIVE
Comment: NEGATIVE
Comment: NEGATIVE
Comment: NORMAL
Diagnosis: NEGATIVE
High risk HPV: NEGATIVE
Neisseria Gonorrhea: NEGATIVE
Trichomonas: NEGATIVE

## 2021-04-04 LAB — HERPES SIMPLEX VIRUS CULTURE

## 2021-08-29 ENCOUNTER — Other Ambulatory Visit: Payer: Self-pay | Admitting: Advanced Practice Midwife

## 2021-08-29 NOTE — Addendum Note (Signed)
Addended by: Tresea Mall on: 08/29/2021 01:48 PM   Modules accepted: Orders

## 2021-09-27 ENCOUNTER — Ambulatory Visit: Payer: Medicaid Other | Admitting: Advanced Practice Midwife

## 2022-03-25 IMAGING — US US PELVIS COMPLETE
1 series · 13 of 25 positions shown · non-contrast
Comparison: CT abdomen pelvis dated 01/12/2019

CLINICAL DATA: Lower abdominal pain

EXAM:
TRANSABDOMINAL AND TRANSVAGINAL ULTRASOUND OF PELVIS
DOPPLER ULTRASOUND OF OVARIES
TECHNIQUE: Both transabdominal and transvaginal ultrasound examinations of the
pelvis were performed. Transabdominal technique was performed for
global imaging of the pelvis including uterus, ovaries, adnexal
regions, and pelvic cul-de-sac.
It was necessary to proceed with endovaginal exam following the
transabdominal exam to visualize the endometrium and ovaries. Color
and duplex Doppler ultrasound was utilized to evaluate blood flow to
the ovaries.

[Series 1: us pelvis (transabdominal only) · 13 of 72 slices shown]
[im 1/72]
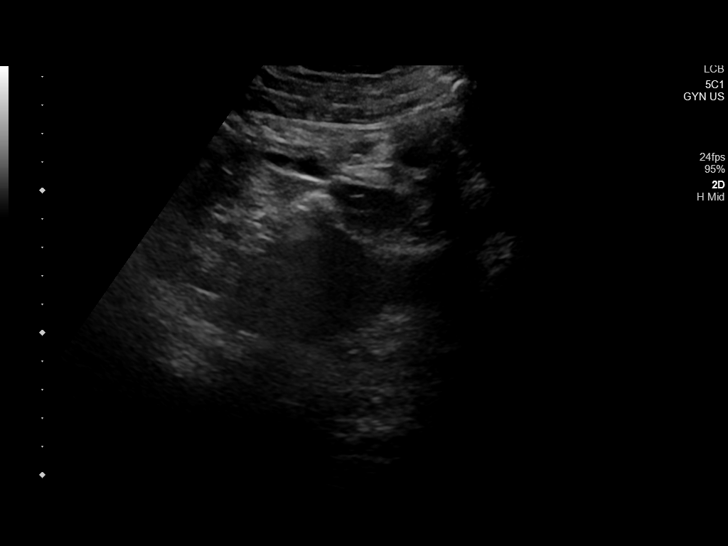
[im 6/72]
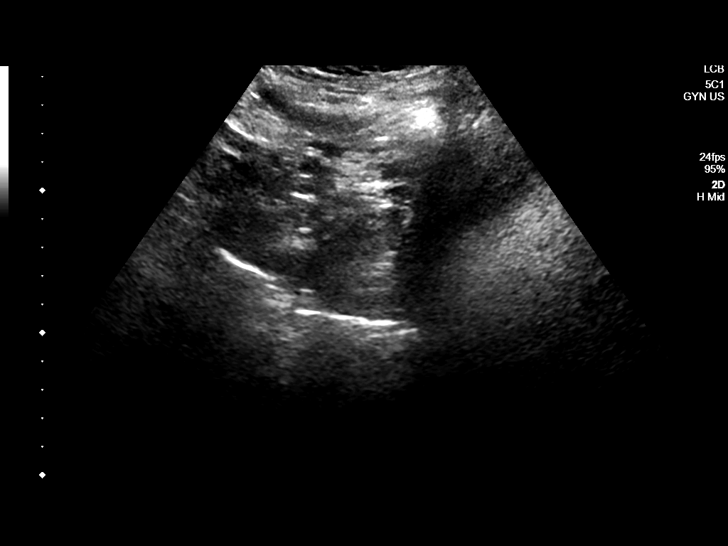
[im 12/72]
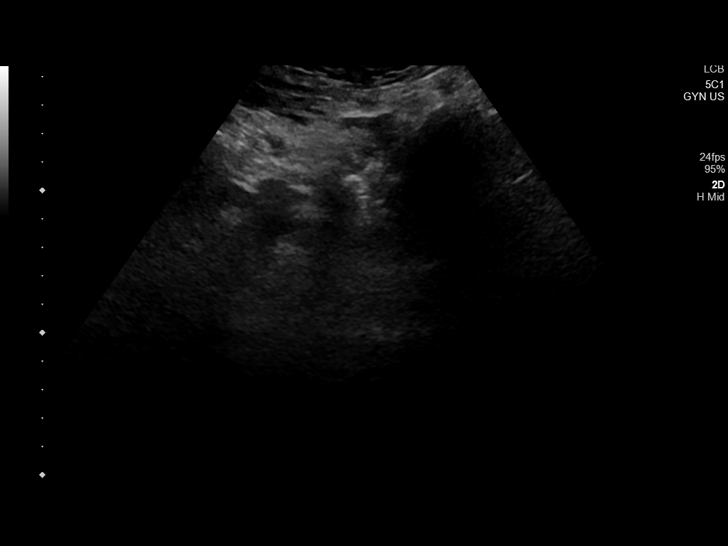
[im 18/72]
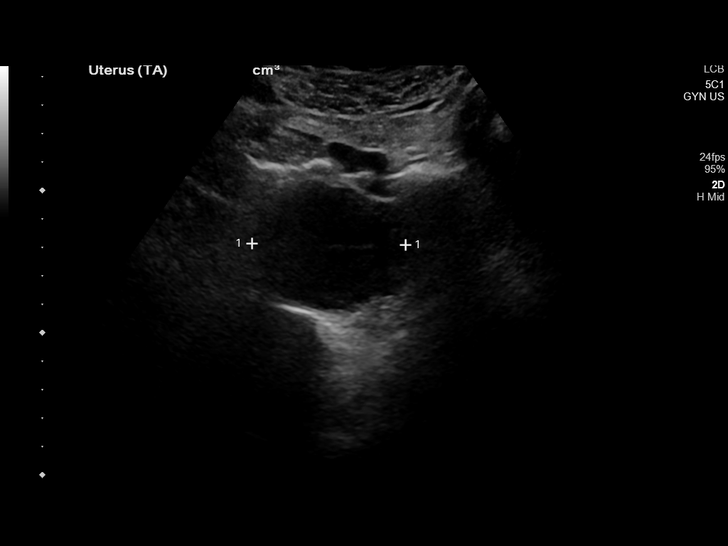
[im 24/72]
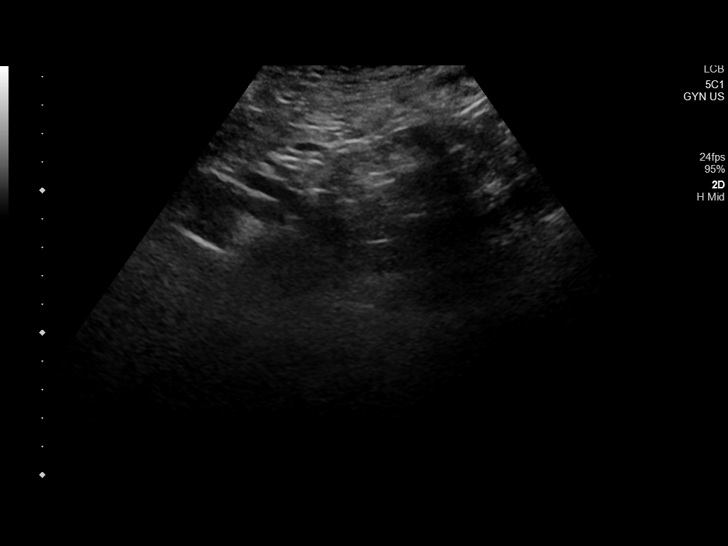
[im 30/72]
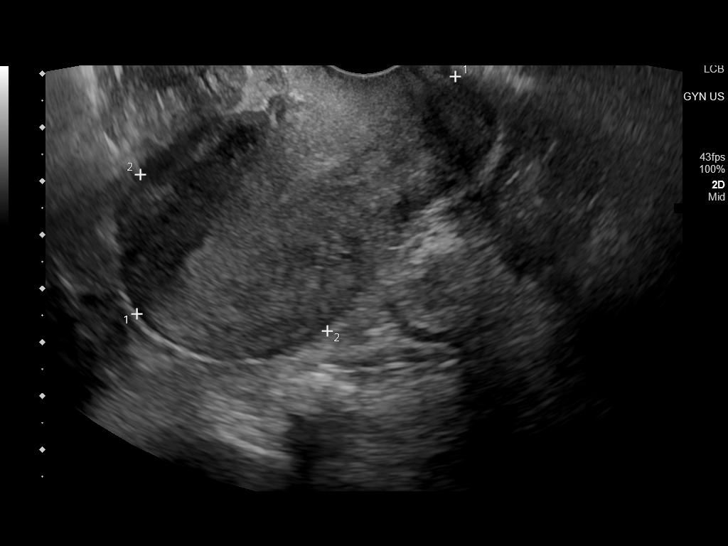
[im 36/72]
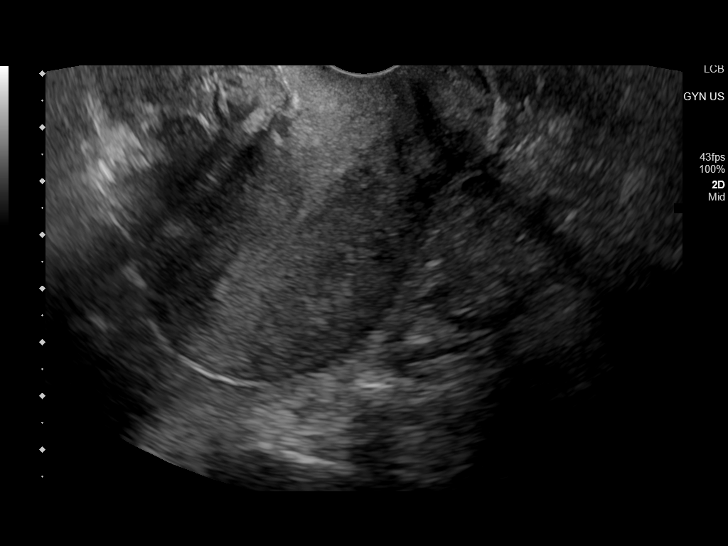
[im 42/72]
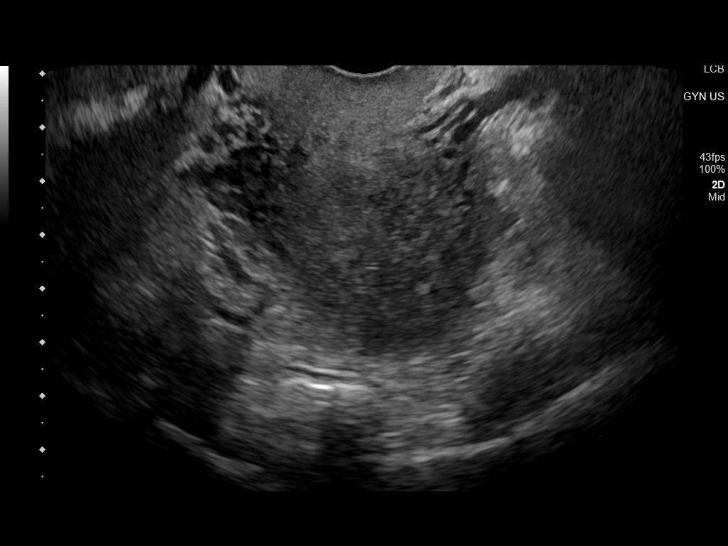
[im 48/72]
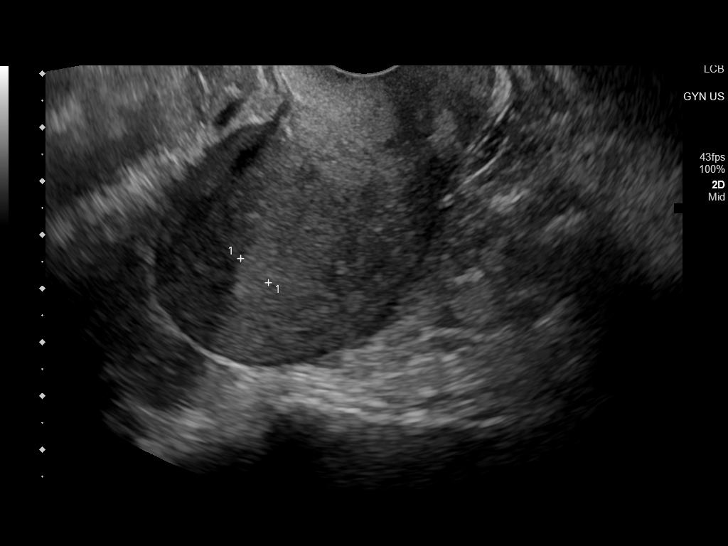
[im 54/72]
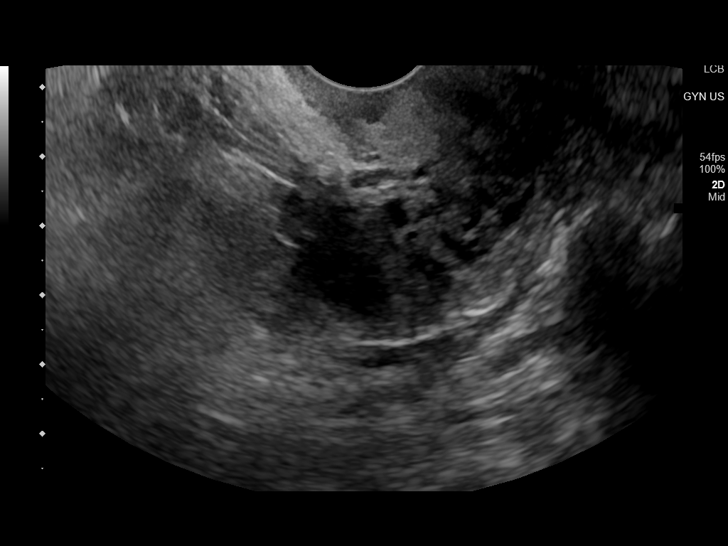
[im 60/72]
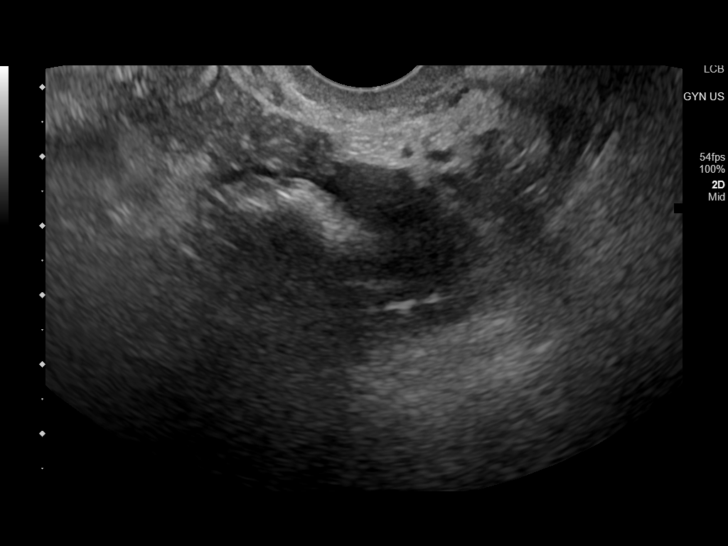
[im 66/72]
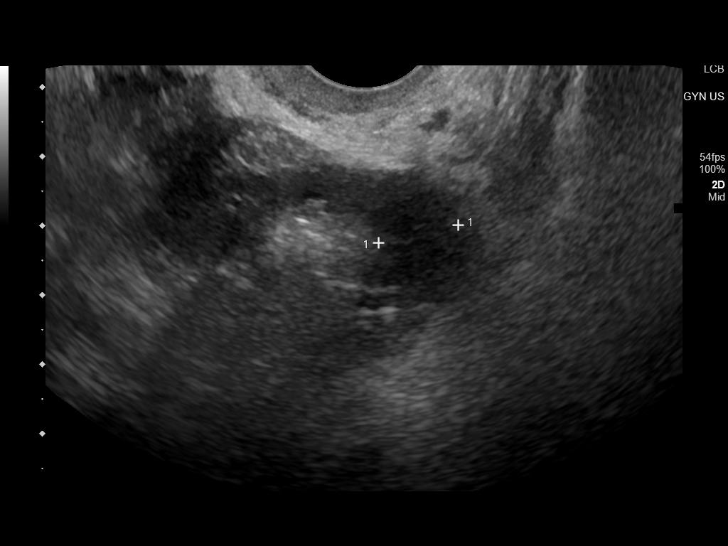
[im 72/72]
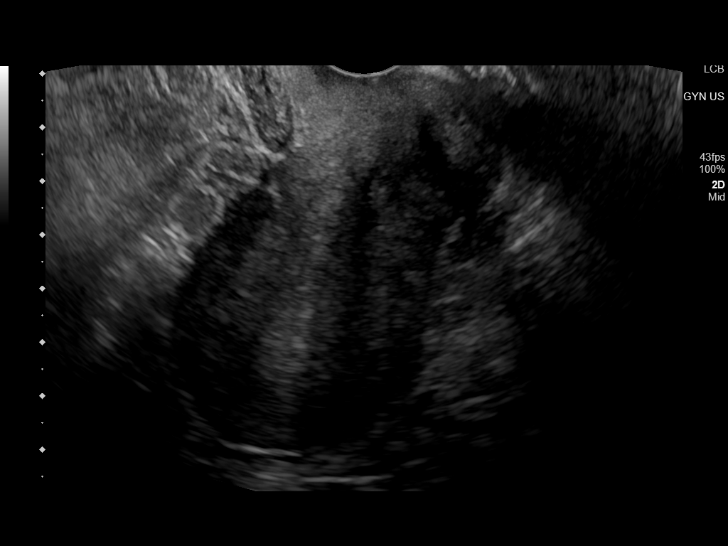

[13 of 25 positions shown; findings below may reference images not displayed]

FINDINGS: Uterus

Measurements: 7.4 x 4.5 x 5.4 cm = volume: 95 mL. No fibroids or
other mass visualized.

Endometrium

Thickness: 7 mm.  No focal abnormality visualized.

Right ovary

Measurements: 3.0 x 1.5 x 2.0 cm = volume: 4 mL. Normal
appearance/no adnexal mass.

Left ovary

Measurements: 2.3 x 1.4 x 1.2 cm = volume: 2 mL. Normal
appearance/no adnexal mass.

Pulsed Doppler evaluation of both ovaries demonstrates normal
low-resistance arterial and venous waveforms.

Other findings

No abnormal free fluid.
IMPRESSION: No findings to explain the patient's symptoms.

## 2022-09-05 ENCOUNTER — Other Ambulatory Visit: Payer: Self-pay | Admitting: Family Medicine

## 2022-09-05 DIAGNOSIS — Z1231 Encounter for screening mammogram for malignant neoplasm of breast: Secondary | ICD-10-CM

## 2022-10-21 ENCOUNTER — Ambulatory Visit (INDEPENDENT_AMBULATORY_CARE_PROVIDER_SITE_OTHER): Payer: Medicaid Other | Admitting: Medical

## 2022-10-21 ENCOUNTER — Other Ambulatory Visit (HOSPITAL_COMMUNITY)
Admission: RE | Admit: 2022-10-21 | Discharge: 2022-10-21 | Disposition: A | Discharge status: Home / Self Care | Payer: Medicaid Other | Source: Ambulatory Visit | Attending: Medical | Admitting: Medical

## 2022-10-21 ENCOUNTER — Encounter: Payer: Self-pay | Admitting: Medical

## 2022-10-21 VITALS — BP 122/80 | Ht 63.0 in | Wt 182.0 lb

## 2022-10-21 DIAGNOSIS — N898 Other specified noninflammatory disorders of vagina: Secondary | ICD-10-CM | POA: Insufficient documentation

## 2022-10-21 DIAGNOSIS — B9689 Other specified bacterial agents as the cause of diseases classified elsewhere: Secondary | ICD-10-CM | POA: Diagnosis not present

## 2022-10-21 DIAGNOSIS — N760 Acute vaginitis: Secondary | ICD-10-CM

## 2022-10-21 DIAGNOSIS — N920 Excessive and frequent menstruation with regular cycle: Secondary | ICD-10-CM

## 2022-10-21 NOTE — Progress Notes (Signed)
   History:  Ms. MARIHA SLEEPER is a 41 y.o. [id number] who presents to clinic today for heavy periods x 4-5 months. The patient states that periods have been heavy with golf ball sized clots for the last few months. Her last period lasted 12 days. She has also recently been treated for BV and still having some discharge and would like to be tested. She has h/O BTL. Normal pap smear 03/29/2021. She states a remote history as a teen of treatments for "fibroids" but unsure of all details. Patient denies any recent history of abnormal uterine bleeding. [place] in 2021 showed no evidence of fibroids.   The following portions of the patient's history were reviewed and updated as appropriate: allergies, current medications, family history, past medical history, social history, past surgical history and problem list.  Review of Systems:  Review of Systems  Constitutional:  Negative for fever, malaise/fatigue and weight loss.  Gastrointestinal:  Negative for abdominal pain.  Genitourinary:        + vaginal discharge Neg- vaginal bleeding      Objective:  Physical Exam BP 122/80   Ht 5\' 3"  (1.6 m)   Wt 182 lb (82.6 kg)   LMP 10/07/2022   BMI 32.24 kg/m  Physical Exam Vitals and nursing note reviewed.  Constitutional:      General: She is not in acute distress.    Appearance: Normal appearance. She is well-developed and normal weight.  HENT:     Head: Normocephalic and atraumatic.  Cardiovascular:     Rate and Rhythm: Normal rate.  Pulmonary:     Effort: Pulmonary effort is normal.  Abdominal:     General: Abdomen is flat. There is no distension.     Palpations: Abdomen is soft. There is no mass.     Tenderness: There is no abdominal tenderness. There is no guarding or rebound.  Skin:    General: Skin is warm and dry.     Findings: No erythema.  Neurological:     Mental Status: She is alert and oriented to person, place, and time.  Psychiatric:        Mood and Affect: Mood normal.      Health Maintenance Due  Topic Date Due   COVID-19 Vaccine (3 - 2023-24 season) 07/26/2022    Labs, imaging and previous visits in Epic reviewed  Assessment & Plan:  1. Menorrhagia with regular cycle - CBC - 09/25/2022 Pelvis Complete; Future  2. Vaginal discharge - Cervicovaginal ancillary only( [place])  Patient will follow-up with GYN surgeon following [place] for further evaluation and treatment options. Patient does not want hormonal options, but would like to consider ablation for management of heavy periods.   [place], PA-C 10/21/2022 3:08 PM

## 2022-10-22 LAB — CBC
Hematocrit: 37.6 % (ref 34.0–46.6)
Hemoglobin: 12.6 g/dL (ref 11.1–15.9)
MCH: 31.9 pg (ref 26.6–33.0)
MCHC: 33.5 g/dL (ref 31.5–35.7)
MCV: 95 fL (ref 79–97)
Platelets: 340 10*3/uL (ref 150–450)
RBC: 3.95 x10E6/uL (ref 3.77–5.28)
RDW: 11.8 % (ref 11.7–15.4)
WBC: 8.2 10*3/uL (ref 3.4–10.8)

## 2022-10-23 LAB — CERVICOVAGINAL ANCILLARY ONLY
Bacterial Vaginitis (gardnerella): POSITIVE — AB
Comment: NEGATIVE

## 2022-10-25 MED ORDER — METRONIDAZOLE 500 MG PO TABS: 500.0000 mg | ORAL_TABLET | Freq: Two times a day (BID) | ORAL | 0 refills | Status: DC

## 2022-10-25 NOTE — Addendum Note (Signed)
Addended by: Marny Lowenstein on: 10/25/2022 09:13 AM   Modules accepted: Orders

## 2022-11-21 ENCOUNTER — Ambulatory Visit (INDEPENDENT_AMBULATORY_CARE_PROVIDER_SITE_OTHER): Payer: Medicaid Other

## 2022-11-21 DIAGNOSIS — N920 Excessive and frequent menstruation with regular cycle: Secondary | ICD-10-CM | POA: Diagnosis not present

## 2022-11-26 ENCOUNTER — Ambulatory Visit: Payer: Medicaid Other | Admitting: Obstetrics and Gynecology

## 2022-11-26 DIAGNOSIS — N920 Excessive and frequent menstruation with regular cycle: Secondary | ICD-10-CM

## 2023-02-04 ENCOUNTER — Encounter (INDEPENDENT_AMBULATORY_CARE_PROVIDER_SITE_OTHER): Payer: Self-pay

## 2023-06-02 ENCOUNTER — Ambulatory Visit: Payer: Medicaid Other

## 2023-08-26 ENCOUNTER — Ambulatory Visit: Payer: Medicaid Other | Admitting: Certified Nurse Midwife

## 2023-08-26 ENCOUNTER — Encounter: Payer: Self-pay | Admitting: Certified Nurse Midwife

## 2023-08-26 ENCOUNTER — Other Ambulatory Visit (HOSPITAL_COMMUNITY)
Admission: RE | Admit: 2023-08-26 | Discharge: 2023-08-26 | Disposition: A | Discharge status: Home / Self Care | Payer: Medicaid Other | Source: Ambulatory Visit | Attending: Certified Nurse Midwife | Admitting: Certified Nurse Midwife

## 2023-08-26 VITALS — BP 117/83 | HR 98 | Wt 187.0 lb

## 2023-08-26 DIAGNOSIS — N898 Other specified noninflammatory disorders of vagina: Secondary | ICD-10-CM | POA: Diagnosis not present

## 2023-08-26 DIAGNOSIS — R350 Frequency of micturition: Secondary | ICD-10-CM | POA: Insufficient documentation

## 2023-08-26 DIAGNOSIS — Z113 Encounter for screening for infections with a predominantly sexual mode of transmission: Secondary | ICD-10-CM | POA: Insufficient documentation

## 2023-08-26 LAB — POCT URINALYSIS DIPSTICK
Bilirubin, UA: NEGATIVE
Blood, UA: NEGATIVE
Glucose, UA: NEGATIVE
Ketones, UA: NEGATIVE
Leukocytes, UA: NEGATIVE
Nitrite, UA: NEGATIVE
Protein, UA: NEGATIVE
Spec Grav, UA: 1.015 (ref 1.010–1.025)
Urobilinogen, UA: 0.2 U/dL
pH, UA: 6.5 (ref 5.0–8.0)

## 2023-08-26 NOTE — Progress Notes (Signed)
    NURSE VISIT NOTE  Subjective:    Patient ID: Amanda [name], female    DOB: Apr 27, 1981, 42 y.o.   MRN: [id number]       HPI  Patient is a 42 y.o. G63P2002 female who presents for urinary frequency and lower back pain with vaginal odor   for a couple days no other concerns, sending urine culture for testing and swab as well. Patient verified her label for ancillary swab and urine culture that she self collected in office.    Objective:    BP 117/83   Pulse 98   Wt 187 lb (84.8 kg)   BMI 33.13 kg/m    Lab Review  No results found for any visits on 08/26/23.  Assessment:   1. Frequency of urination   2. Vaginal odor      Plan:   Urine Culture Sent.   Swab sent to lab.   Loney Laurence, CMA

## 2023-08-27 LAB — CERVICOVAGINAL ANCILLARY ONLY
Bacterial Vaginitis (gardnerella): POSITIVE — AB
Candida Glabrata: NEGATIVE
Candida Vaginitis: NEGATIVE
Chlamydia: NEGATIVE
Comment: NEGATIVE
Comment: NEGATIVE
Comment: NEGATIVE
Comment: NEGATIVE
Comment: NEGATIVE
Comment: NORMAL
Neisseria Gonorrhea: NEGATIVE
Trichomonas: NEGATIVE

## 2023-08-28 LAB — URINE CULTURE

## 2023-08-28 MED ORDER — METRONIDAZOLE 500 MG PO TABS: 500.0000 mg | ORAL_TABLET | Freq: Two times a day (BID) | ORAL | 0 refills | Status: AC

## 2023-12-24 ENCOUNTER — Telehealth: Payer: Self-pay

## 2023-12-24 DIAGNOSIS — N898 Other specified noninflammatory disorders of vagina: Secondary | ICD-10-CM

## 2023-12-24 MED ORDER — METRONIDAZOLE 500 MG PO TABS: 500.0000 mg | ORAL_TABLET | Freq: Two times a day (BID) | ORAL | 0 refills | Status: DC

## 2023-12-24 NOTE — Telephone Encounter (Signed)
Left voicemail to return call.

## 2023-12-24 NOTE — Telephone Encounter (Signed)
[facility] VOICEMAIL: Patient returned call.

## 2023-12-24 NOTE — Telephone Encounter (Signed)
Chart reviewed. Patient was BV+ 08/2023. Inquired what symptoms she is having she states she is having the same vaginal discharge with fishy odor. She denies burning or itching. She is using probiotics and vaginal wash. Patient denies any new sexual partner or STD exposure. She has had it a few times since October but her PCP has provided rx. Advised will send rx for metronidazole. Patient last annual 09/2022. Advised to schedule annual ASAP. Also advised after metronidazole can try OTC Boric acid suppositories weekly to see if that will help prevent reoccurrence.

## 2023-12-24 NOTE — Telephone Encounter (Signed)
[facility] VOICEMAIL: Patient states she is 99% sure she has BV. Requesting rx. Willing to come in for self swab if needed.

## 2024-03-04 ENCOUNTER — Telehealth: Payer: Self-pay | Admitting: General Practice

## 2024-03-04 NOTE — Telephone Encounter (Signed)
 Patient already has appt on 04/27/24

## 2024-03-04 NOTE — Telephone Encounter (Signed)
 Copied from CRM [id number]. Topic: Appointments - Transfer of Care >> Mar 04, 2024 12:11 PM Aisha D wrote: Pt is requesting to transfer FROM: Dr.Christine Williams Pt is requesting to transfer TO: [facility], NP [place] Charon Reason for requested transfer: Looking for new PCP It is the responsibility of the team the patient would like to transfer to (Dr. Evelene Croon) to reach out to the patient if for any reason this transfer is not acceptable.

## 2024-03-15 ENCOUNTER — Other Ambulatory Visit (HOSPITAL_COMMUNITY)
Admission: RE | Admit: 2024-03-15 | Discharge: 2024-03-15 | Disposition: A | Discharge status: Home / Self Care | Source: Ambulatory Visit | Attending: Obstetrics | Admitting: Obstetrics

## 2024-03-15 ENCOUNTER — Ambulatory Visit (INDEPENDENT_AMBULATORY_CARE_PROVIDER_SITE_OTHER): Admitting: Obstetrics

## 2024-03-15 ENCOUNTER — Encounter: Payer: Self-pay | Admitting: Obstetrics

## 2024-03-15 VITALS — BP 105/72 | HR 90 | Ht 63.0 in | Wt 187.0 lb

## 2024-03-15 DIAGNOSIS — E669 Obesity, unspecified: Secondary | ICD-10-CM

## 2024-03-15 DIAGNOSIS — D229 Melanocytic nevi, unspecified: Secondary | ICD-10-CM

## 2024-03-15 DIAGNOSIS — D72828 Other elevated white blood cell count: Secondary | ICD-10-CM

## 2024-03-15 DIAGNOSIS — Z01419 Encounter for gynecological examination (general) (routine) without abnormal findings: Secondary | ICD-10-CM | POA: Diagnosis not present

## 2024-03-15 DIAGNOSIS — Z113 Encounter for screening for infections with a predominantly sexual mode of transmission: Secondary | ICD-10-CM

## 2024-03-15 DIAGNOSIS — Z800 Family history of malignant neoplasm of digestive organs: Secondary | ICD-10-CM

## 2024-03-15 DIAGNOSIS — Z1231 Encounter for screening mammogram for malignant neoplasm of breast: Secondary | ICD-10-CM

## 2024-03-15 DIAGNOSIS — Z0000 Encounter for general adult medical examination without abnormal findings: Secondary | ICD-10-CM

## 2024-03-15 DIAGNOSIS — N189 Chronic kidney disease, unspecified: Secondary | ICD-10-CM

## 2024-03-15 NOTE — Progress Notes (Signed)
 ANNUAL GYNECOLOGICAL EXAM  SUBJECTIVE  HPI  Amanda [name] is a 43 y.o.-year-old G2P2002 who presents for an annual gynecological exam today.  She denies pelvic pain, dyspareunia, abnormal vaginal bleeding or discharge, and UTI symptoms. She has a h/o of frequent UTIs, but that has not been a problem since she had kidney surgery. She has been seeing a kidney specialist yearly but has not been in a while. She has been using boric acid about once a week and that has improved her BV symptoms. She is currently sexually active with one female partner. She has been having increased difficulty taking a deep breath and has been SOB at rest. She was seen in urgent care. She has a PCP visit scheduled at the beginning of next month. She strongly desires to quit smoking but has not had success despite using patches and gum.  Medical/Surgical History Past Medical History:  Diagnosis Date   Anxiety    COPD (chronic obstructive pulmonary disease) (HCC)    Fibromyalgia    Kidney stones    Renal disorder    UTI (urinary tract infection)    Past Surgical History:  Procedure Laterality Date   CESAREAN SECTION     DENTAL SURGERY     EYE SURGERY     KIDNEY SURGERY     TUBAL LIGATION      Social History Lives with daughter. Feels safe there Work: Social worker Exercise: yoga Substances: Smokes 1/2 ppd (h/o smoking 1.5 ppd since age 44)  Obstetric History OB History     Gravida  2   Para  2   Term  2   Preterm      AB      Living  2      SAB      IAB      Ectopic      Multiple      Live Births  2            GYN/Menstrual History Patient's last menstrual period was 02/23/2024. Regular monthly periods Last Pap: 03/29/2021 Contraception: BTL  Prevention Mammogram: had one at 40 Colonoscopy:  family h/o colon cancer - will order colonoscopy   Current Medications Outpatient Medications Prior to Visit  Medication Sig   ADDERALL XR 30 MG 24 hr capsule Take 30 mg by  mouth daily.   ALPRAZolam (XANAX) 1 MG tablet Take 1 mg by mouth 3 (three) times daily as needed for anxiety.    aspirin-acetaminophen -caffeine (EXCEDRIN MIGRAINE) 250-250-65 MG tablet Take 1-2 tablets by mouth every 6 (six) hours as needed for headache.    levothyroxine (SYNTHROID) 75 MCG tablet Take 75 mcg by mouth daily.   amphetamine-dextroamphetamine (ADDERALL) 5 MG tablet Take 5 mg by mouth daily.  (Patient not taking: Reported on 10/21/2022)   Budeson-Glycopyrrol-Formoterol 160-9-4.8 MCG/ACT AERO Inhale 1 puff into the lungs 2 (two) times daily. (Patient not taking: Reported on 03/15/2024)   buPROPion (WELLBUTRIN XL) 300 MG 24 hr tablet Take 1 tablet by mouth every morning. (Patient not taking: Reported on 03/15/2024)   cyclobenzaprine (FLEXERIL) 10 MG tablet Take by mouth. (Patient not taking: Reported on 03/15/2024)   metroNIDAZOLE  (FLAGYL ) 500 MG tablet Take 1 tablet (500 mg total) by mouth 2 (two) times daily. (Patient not taking: Reported on 03/15/2024)   oxybutynin (DITROPAN) 5 MG tablet Take by mouth. (Patient not taking: Reported on 03/15/2024)   valACYclovir  (VALTREX ) 500 MG tablet Take 1 tablet (500 mg total) by mouth daily.   No facility-administered  medications prior to visit.      The pregnancy intention screening data noted above was reviewed. Potential methods of contraception were discussed. The patient elected to proceed with No data recorded.   ROS Constitutional: Denied constitutional symptoms, night sweats, recent illness, fatigue, fever, insomnia and weight loss.  Eyes: Denied eye symptoms, eye pain, photophobia, vision change and visual disturbance.  Ears/Nose/Throat/Neck: Denied ear, nose, throat or neck symptoms, hearing loss, nasal discharge, sinus congestion and sore throat.  Cardiovascular: Denied cardiovascular symptoms, arrhythmia, chest pain/pressure, edema, exercise intolerance, orthopnea and palpitations.  Respiratory: +SOB at rest, difficulty taking deep  breath, "band around chest"  Gastrointestinal: Denied gastro-esophageal reflux, melena, nausea and vomiting.  Genitourinary: Denied genitourinary symptoms including symptomatic vaginal discharge, pelvic relaxation issues, and urinary complaints.  Musculoskeletal: Denied musculoskeletal symptoms, stiffness, swelling, muscle weakness and myalgia.  Dermatologic: Denied dermatology symptoms, rash and scar.  Neurologic: Denied neurology symptoms, dizziness, headache, neck pain and syncope.  Psychiatric: Denied psychiatric symptoms, anxiety and depression.  Endocrine: Denied endocrine symptoms including hot flashes and night sweats.    OBJECTIVE  BP 105/72   Pulse 90   Ht 5\' 3"  (1.6 m)   Wt 187 lb (84.8 kg)   LMP 02/23/2024   BMI 33.13 kg/m    Physical examination General NAD, Conversant  HEENT Atraumatic; Op clear with mmm.  Normo-cephalic. Pupils reactive. Anicteric sclerae  Thyroid/Neck Smooth without nodularity or enlargement. Normal ROM.  Neck Supple.  Skin No rashes, lesions or ulceration. Normal palpated skin turgor. No nodularity.  Breasts: No masses or discharge.  Symmetric.  No axillary adenopathy.  Lungs: Expiratory wheezes bilaterally. Diminished breath sounds.  Normal Respiratory effort, no retractions. Cough with deep breaths.  Heart: NSR.  No murmurs or rubs appreciated. No peripheral edema  Abdomen: Soft.  Non-tender.  No masses.  No HSM. No hernia  Extremities: Moves all appropriately.  Normal ROM for age. No lymphadenopathy.  Neuro: Oriented to PPT.  Normal mood. Normal affect.     Pelvic: Declined    ASSESSMENT  1) Annual exam 2) H/o kidney disease - need referral  PLAN 1) Physical exam as noted. Discussed healthy lifestyle choices and preventive care. STI testing today. Labs: A1C, CBC, TSH, lipid profile. Mammogram ordered. Referral to dermatology for skin check. Colonoscopy ordered. Pap due 2027. 2) Referral to nephrology sent 3) PCP follow up  Return in one  year for annual exam or as needed for concerns.   Alayshia Marini, CNM

## 2024-03-16 LAB — CBC WITH DIFFERENTIAL/PLATELET
Basophils Absolute: 0.1 10*3/uL (ref 0.0–0.2)
Basos: 1 %
EOS (ABSOLUTE): 0.2 10*3/uL (ref 0.0–0.4)
Eos: 3 %
Hematocrit: 36.7 % (ref 34.0–46.6)
Hemoglobin: 12.5 g/dL (ref 11.1–15.9)
Immature Grans (Abs): 0 10*3/uL (ref 0.0–0.1)
Immature Granulocytes: 0 %
Lymphocytes Absolute: 1.8 10*3/uL (ref 0.7–3.1)
Lymphs: 26 %
MCH: 32.7 pg (ref 26.6–33.0)
MCHC: 34.1 g/dL (ref 31.5–35.7)
MCV: 96 fL (ref 79–97)
Monocytes Absolute: 0.6 10*3/uL (ref 0.1–0.9)
Monocytes: 8 %
Neutrophils Absolute: 4.2 10*3/uL (ref 1.4–7.0)
Neutrophils: 62 %
Platelets: 298 10*3/uL (ref 150–450)
RBC: 3.82 x10E6/uL (ref 3.77–5.28)
RDW: 12.2 % (ref 11.7–15.4)
WBC: 6.8 10*3/uL (ref 3.4–10.8)

## 2024-03-16 LAB — LIPID PANEL
Chol/HDL Ratio: 2.5 ratio (ref 0.0–4.4)
Cholesterol, Total: 141 mg/dL (ref 100–199)
HDL: 56 mg/dL (ref >39)
LDL Chol Calc (NIH): 74 mg/dL (ref 0–99)
Triglycerides: 51 mg/dL (ref 0–149)
VLDL Cholesterol Cal: 11 mg/dL (ref 5–40)

## 2024-03-16 LAB — TSH: TSH: 1.5 u[IU]/mL (ref 0.450–4.500)

## 2024-03-16 LAB — HEMOGLOBIN A1C
Est. average glucose Bld gHb Est-mCnc: 100 mg/dL
Hgb A1c MFr Bld: 5.1 % (ref 4.8–5.6)

## 2024-03-16 LAB — HEP, RPR, HIV PANEL
HIV Screen 4th Generation wRfx: NONREACTIVE
Hepatitis B Surface Ag: NEGATIVE
RPR Ser Ql: NONREACTIVE

## 2024-03-17 ENCOUNTER — Telehealth: Payer: Self-pay

## 2024-03-17 LAB — CERVICOVAGINAL ANCILLARY ONLY
Bacterial Vaginitis (gardnerella): NEGATIVE
Candida Glabrata: NEGATIVE
Candida Vaginitis: NEGATIVE
Chlamydia: NEGATIVE
Comment: NEGATIVE
Comment: NEGATIVE
Comment: NEGATIVE
Comment: NEGATIVE
Comment: NEGATIVE
Comment: NORMAL
Neisseria Gonorrhea: NEGATIVE
Trichomonas: NEGATIVE

## 2024-03-17 NOTE — Telephone Encounter (Signed)
 Patient called and left a voicemail requesting to schedule a colonoscopy and inquiring about the status of her referral. I returned the call and confirmed that we received her message and her referral. I informed the patient that, as she is under the age of 45, an office visit is required prior to scheduling the procedure. I also advised her that we currently do not have any available appointments for new patients. I offered to add her to our call-back list and assured her that we will contact her as soon as an appointment becomes available.

## 2024-03-18 ENCOUNTER — Encounter: Payer: Self-pay | Admitting: Obstetrics

## 2024-03-29 ENCOUNTER — Telehealth: Payer: Self-pay

## 2024-03-29 NOTE — Telephone Encounter (Signed)
 Recv'd referral-scheduled 04-30-23 @ 10:40 am @ [facility] with Elenora Griffiths and phone number provided to patient.

## 2024-04-07 ENCOUNTER — Encounter: Payer: Self-pay | Admitting: General Practice

## 2024-04-07 ENCOUNTER — Ambulatory Visit (INDEPENDENT_AMBULATORY_CARE_PROVIDER_SITE_OTHER): Admitting: General Practice

## 2024-04-07 VITALS — BP 118/78 | HR 110 | Temp 98.2°F | Ht 63.0 in | Wt 188.0 lb

## 2024-04-07 DIAGNOSIS — Z720 Tobacco use: Secondary | ICD-10-CM | POA: Insufficient documentation

## 2024-04-07 DIAGNOSIS — E039 Hypothyroidism, unspecified: Secondary | ICD-10-CM | POA: Diagnosis not present

## 2024-04-07 DIAGNOSIS — N898 Other specified noninflammatory disorders of vagina: Secondary | ICD-10-CM | POA: Diagnosis not present

## 2024-04-07 DIAGNOSIS — F339 Major depressive disorder, recurrent, unspecified: Secondary | ICD-10-CM

## 2024-04-07 DIAGNOSIS — A6000 Herpesviral infection of urogenital system, unspecified: Secondary | ICD-10-CM | POA: Insufficient documentation

## 2024-04-07 DIAGNOSIS — F988 Other specified behavioral and emotional disorders with onset usually occurring in childhood and adolescence: Secondary | ICD-10-CM

## 2024-04-07 DIAGNOSIS — Z7689 Persons encountering health services in other specified circumstances: Secondary | ICD-10-CM | POA: Insufficient documentation

## 2024-04-07 LAB — POC URINALSYSI DIPSTICK (AUTOMATED)
Bilirubin, UA: NEGATIVE
Blood, UA: NEGATIVE
Glucose, UA: NEGATIVE
Ketones, UA: NEGATIVE
Leukocytes, UA: NEGATIVE
Nitrite, UA: NEGATIVE
Protein, UA: NEGATIVE
Spec Grav, UA: <=1.005 — AB (ref 1.010–1.025)
Urobilinogen, UA: 0.2 U/dL
pH, UA: 7 (ref 5.0–8.0)

## 2024-04-07 NOTE — Patient Instructions (Addendum)
 I will be in touch with the swab results and the urine culture results.   Schedule tdap and pneumonia at health department or pharmacy.  Let me know when you need refills for your levothyroxine.   It was a pleasure to meet you today! Please don't hesitate to contact me with any questions. Welcome to [facility]!

## 2024-04-07 NOTE — Assessment & Plan Note (Signed)
 Smoking cessation instruction/counseling given:  counseled patient on the dangers of tobacco use, advised patient to stop smoking, and reviewed strategies to maximize success

## 2024-04-07 NOTE — Assessment & Plan Note (Signed)
 Unclear etiology. POC urinalysis shows no leukocytes, nitrates, blood. Exam stable. Urine culture and wet prep self swab pending.  Does endorse a history of BV in the past .discussed avoiding douche and other harsh body wash.  Await results.

## 2024-04-07 NOTE — Assessment & Plan Note (Signed)
 Controlled. Followed by gynecology. No recent flareups. Continue Valtrex  as needed.

## 2024-04-07 NOTE — Progress Notes (Signed)
 New Patient Office Visit  Subjective    Patient ID: Amanda [name], female    DOB: [birth year]  Age: 43 y.o. MRN: [id number]  CC:  Chief Complaint  Patient presents with   New Patient (Initial Visit)   Vaginal Itching    And odor; patient gets infections easily if she does not use the correct washes.     HPI Amanda [name] is a 43 y.o. female presents to establish care.  Last PCP/physical/labs: Susan Ziglar, MD practice closed down. Physical was within the year.  Vaginal itching and odor: she has a history of BV and has been using boric acid suppositories. She does have slight burning while urinating. No new sexual partner. She followed by gynecology and had her physical with them a month ago. Her last pap was 2022. She hs a history of kidney stones. She has noticed that her symptoms are triggered some body washes.   ADHD/depression: followed by Dr. Sheria Dills, psychiatrist. [facility] in [place]. She is currently managed on Adderal ER 30 mg capsule in the AM and Adderall 30 mg tab, short acting, In the afternoon. She is currently taking Xanax 1 mg once daily. She lost her husband 5 years ago. She has been working on weaning down on the xanax.   Hypothyroidism- diagnosed several years ago. Her last TSH was on 03/15/24 which was normal. She is currently managed on Levothyroxine 75 mcg once daily.   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 Takes valtrex  500 mg as needed.  Outpatient Encounter Medications as of 04/07/2024  Medication Sig   ADDERALL XR 30 MG 24 hr capsule Take 30 mg by mouth daily.   ALPRAZolam (XANAX) 1 MG tablet Take 1 mg by mouth 2 (two) times daily as needed for anxiety.   aspirin-acetaminophen -caffeine (EXCEDRIN MIGRAINE) 250-250-65 MG tablet Take 1-2 tablets by mouth every 6 (six) hours as needed for headache.    Budeson-Glycopyrrol-Formoterol 160-9-4.8 MCG/ACT AERO Inhale 1 puff into the lungs 2 (two) times daily.   levothyroxine (SYNTHROID) 75 MCG  tablet Take 75 mcg by mouth daily.   valACYclovir  (VALTREX ) 500 MG tablet Take 1 tablet (500 mg total) by mouth daily.   [DISCONTINUED] amphetamine-dextroamphetamine (ADDERALL) 5 MG tablet Take 5 mg by mouth daily.  (Patient not taking: Reported on 10/21/2022)   [DISCONTINUED] buPROPion (WELLBUTRIN XL) 300 MG 24 hr tablet Take 1 tablet by mouth every morning. (Patient not taking: Reported on 03/15/2024)   [DISCONTINUED] cyclobenzaprine (FLEXERIL) 10 MG tablet Take by mouth. (Patient not taking: Reported on 03/15/2024)   [DISCONTINUED] metroNIDAZOLE  (FLAGYL ) 500 MG tablet Take 1 tablet (500 mg total) by mouth 2 (two) times daily. (Patient not taking: Reported on 03/15/2024)   [DISCONTINUED] oxybutynin (DITROPAN) 5 MG tablet Take by mouth. (Patient not taking: Reported on 03/15/2024)   No facility-administered encounter medications on file as of 04/07/2024.    Past Medical History:  Diagnosis Date   Anxiety    COPD (chronic obstructive pulmonary disease) (HCC)    Fibromyalgia    Kidney stones    Renal disorder    UTI (urinary tract infection)     Past Surgical History:  Procedure Laterality Date   CESAREAN SECTION     DENTAL SURGERY     EYE SURGERY     KIDNEY SURGERY     TUBAL LIGATION      Family History  Problem Relation Age of Onset   Fibroids Mother    Kidney Stones Other    Crohn's disease Sister  Social History   Socioeconomic History   Marital status: Widowed    Spouse name: Not on file   Number of children: Not on file   Years of education: Not on file   Highest education level: Not on file  Occupational History   Not on file  Tobacco Use   Smoking status: Every Day    Types: Cigarettes   Smokeless tobacco: Never  Vaping Use   Vaping status: Never Used  Substance and Sexual Activity   Alcohol use: Yes    Comment: occ   Drug use: Yes    Types: Marijuana    Comment: occ   Sexual activity: Yes    Birth control/protection: Surgical    Comment: Tubal   Other Topics Concern   Not on file  Social History Narrative   Not on file   Social Drivers of Health   Financial Resource Strain: Not on file  Food Insecurity: No Food Insecurity (09/14/2019)   Received [facility], [facility]   Hunger Vital Sign    Worried About Running Out of Food in the Last Year: Never true    Ran Out of Food in the Last Year: Never true  Transportation Needs: Not on file  Physical Activity: Not on file  Stress: Not on file  Social Connections: Unknown (04/09/2022)   Received from [facility], [facility]   Social Network    Social Network: Not on file  Intimate Partner Violence: Unknown (03/01/2022)   [facility] from [facility], [facility]   HITS    Physically Hurt: Not on file    Insult or Talk Down To: Not on file    Threaten Physical Harm: Not on file    Scream or Curse: Not on file    Review of Systems  Constitutional:  Negative for chills and fever.  Respiratory:  Negative for shortness of breath.   Cardiovascular:  Negative for chest pain.  Gastrointestinal:  Negative for abdominal pain, constipation, diarrhea, heartburn, nausea and vomiting.  Genitourinary:  Negative for dysuria, frequency and urgency.  Neurological:  Negative for dizziness and headaches.  Endo/Heme/Allergies:  Negative for polydipsia.  Psychiatric/Behavioral:  Negative for depression and suicidal ideas. The patient is not nervous/anxious.         Objective    BP 118/78 (BP Location: Left Arm, Patient Position: Sitting, Cuff Size: Large)   Pulse (!) 110   Temp 98.2 F (36.8 C) (Oral)   Ht 5\' 3"  (1.6 m)   Wt 188 lb (85.3 kg)   LMP 02/23/2024   SpO2 95%   BMI 33.30 kg/m   Physical Exam Vitals and nursing note reviewed.  Constitutional:      Appearance: Normal appearance.  Cardiovascular:     Rate and Rhythm: Normal rate and regular rhythm.     Pulses: Normal pulses.     Heart sounds: Normal heart sounds.  Pulmonary:     Effort: Pulmonary  effort is normal.     Breath sounds: Normal breath sounds.  Neurological:     Mental Status: She is alert and oriented to person, place, and time.  Psychiatric:        Mood and Affect: Mood normal.        Behavior: Behavior normal.        Thought Content: Thought content normal.        Judgment: Judgment normal.         Assessment & Plan:  Vaginal odor Assessment & Plan: Unclear  etiology. POC urinalysis shows no leukocytes, nitrates, blood. Exam stable. Urine culture and wet prep self swab pending.  Does endorse a history of BV in the past .discussed avoiding douche and other harsh body wash.  Await results.  Orders: -     WET PREP BY MOLECULAR PROBE -     POCT Urinalysis Dipstick (Automated) -     Urine Culture  Establishing care with new doctor, encounter for Assessment & Plan: EMR reviewed briefly.    Attention deficit disorder of adult Assessment & Plan: Controlled.  Followed by psychiatry.  Continue Adderall ER 30 mg capsule once in the morning and Adderall 30 mg tablet in the afternoon. Continue Xanax 1 mg once daily. Discussed risks of long-term use of benzodiazepines.  Reports she has been trying to wean off.   Recurrent major depressive disorder, remission status unspecified (HCC) Assessment & Plan: Followed by psychiatry.  Continue Xanax 1 mg once daily.  Discussed risks of benzodiazepines.   Hypothyroidism, unspecified type Assessment & Plan: Controlled. Reviewed TSH levels and other labs from April 2025. Continue levothyroxine 75 mcg once daily.  She has been taking it correctly.  Follow-up in April for physical and labs.   Genital herpes simplex, unspecified site Assessment & Plan: Controlled. Followed by gynecology. No recent flareups. Continue Valtrex  as needed.   Tobacco use Assessment & Plan: Smoking cessation instruction/counseling given:  counseled patient on the dangers of tobacco use, advised patient to stop smoking, and  reviewed strategies to maximize success      Return in about 11 months (around 03/15/2025) for physical and labs.Jolanda Nation, NP

## 2024-04-07 NOTE — Assessment & Plan Note (Signed)
 Followed by psychiatry.  Continue Xanax 1 mg once daily.  Discussed risks of benzodiazepines.

## 2024-04-07 NOTE — Assessment & Plan Note (Signed)
 EMR reviewed briefly.

## 2024-04-07 NOTE — Assessment & Plan Note (Addendum)
 Controlled.  Followed by psychiatry.  Continue Adderall ER 30 mg capsule once in the morning and Adderall 30 mg tablet in the afternoon. Continue Xanax 1 mg once daily. Discussed risks of long-term use of benzodiazepines.  Reports she has been trying to wean off.

## 2024-04-07 NOTE — Assessment & Plan Note (Addendum)
 Controlled. Reviewed TSH levels and other labs from April 2025. Continue levothyroxine 75 mcg once daily.  She has been taking it correctly.  Follow-up in April for physical and labs.

## 2024-04-08 ENCOUNTER — Ambulatory Visit: Payer: Self-pay | Admitting: General Practice

## 2024-04-08 ENCOUNTER — Encounter: Payer: Self-pay | Admitting: General Practice

## 2024-04-08 ENCOUNTER — Other Ambulatory Visit: Payer: Self-pay | Admitting: General Practice

## 2024-04-08 DIAGNOSIS — B9689 Other specified bacterial agents as the cause of diseases classified elsewhere: Secondary | ICD-10-CM

## 2024-04-08 LAB — WET PREP BY MOLECULAR PROBE
Candida species: NOT DETECTED
MICRO NUMBER:: 16454796
SPECIMEN QUALITY:: ADEQUATE
Trichomonas vaginosis: NOT DETECTED

## 2024-04-08 LAB — URINE CULTURE
MICRO NUMBER:: 16454797
Result:: NO GROWTH
SPECIMEN QUALITY:: ADEQUATE

## 2024-04-08 MED ORDER — METRONIDAZOLE 500 MG PO TABS: 500.0000 mg | ORAL_TABLET | Freq: Two times a day (BID) | ORAL | 0 refills | Status: AC

## 2024-04-09 NOTE — Telephone Encounter (Signed)
 See other mychart message, this has been addressed.

## 2024-04-12 DIAGNOSIS — N390 Urinary tract infection, site not specified: Secondary | ICD-10-CM | POA: Insufficient documentation

## 2024-04-27 ENCOUNTER — Ambulatory Visit: Admitting: Pediatrics

## 2024-04-27 ENCOUNTER — Ambulatory Visit: Admitting: General Practice

## 2024-04-28 NOTE — Progress Notes (Deleted)
 Amanda Canard, PA-C [address], [place]  [address] Phone: [phone]   Gastroenterology Consultation  Referring Provider:     Phylliss Brenner, CNM Primary Care Physician:  Jolanda Nation, NP Primary Gastroenterologist:  Amanda Canard, PA-C / *** Reason for Consultation:     Hemorrhoids, discuss colonoscopy        HPI:   Amanda [name] is a 43 y.o. y/o female referred for consultation & management  by Jolanda Nation, NP.    Current symptoms:  Previous colonoscopy?  Patient was previously seen at [facility] 03/2017 for chronic constipation and chronic lower abdominal pain.    She has family history of Crohn's disease, Family history of colon cancer and family history of colon polyps.  PMH: COPD, hypothyroidism, nephrolithiasis, depression.  Past Medical History:  Diagnosis Date   Anxiety    COPD (chronic obstructive pulmonary disease) (HCC)    Fibromyalgia    Kidney stones    Renal disorder    UTI (urinary tract infection)     Past Surgical History:  Procedure Laterality Date   CESAREAN SECTION     DENTAL SURGERY     EYE SURGERY     KIDNEY SURGERY     TUBAL LIGATION      Prior to Admission medications   Medication Sig Start Date End Date Taking? Authorizing Provider  ADDERALL XR 30 MG 24 hr capsule Take 30 mg by mouth daily. 02/29/20   [provider]  ALPRAZolam (XANAX) 1 MG tablet Take 1 mg by mouth 2 (two) times daily as needed for anxiety.    [provider]  aspirin-acetaminophen -caffeine (EXCEDRIN MIGRAINE) 250-250-65 MG tablet Take 1-2 tablets by mouth every 6 (six) hours as needed for headache.     [provider]  Budeson-Glycopyrrol-Formoterol 160-9-4.8 MCG/ACT AERO Inhale 1 puff into the lungs 2 (two) times daily.    [provider]  levothyroxine (SYNTHROID) 75 MCG tablet Take 75 mcg by mouth daily. 01/22/21   [provider]  valACYclovir  (VALTREX ) 500 MG tablet Take 1  tablet (500 mg total) by mouth daily. 03/29/21   Angelita Kendall, CNM    Family History  Problem Relation Age of Onset   Fibroids Mother    Kidney Stones Other    Crohn's disease Sister      Social History   Tobacco Use   Smoking status: Every Day    Types: Cigarettes   Smokeless tobacco: Never  Vaping Use   Vaping status: Never Used  Substance Use Topics   Alcohol use: Yes    Comment: occ   Drug use: Yes    Types: Marijuana    Comment: occ    Allergies as of 04/29/2024 - Review Complete 04/07/2024  Allergen Reaction Noted   Cephalexin  Hives, Itching, and Swelling 01/22/2019   Cyclobenzaprine Other (See Comments) 02/24/2024   Macrolides and ketolides Anaphylaxis 05/17/2020   Latex Itching and Swelling 11/25/2015    Review of Systems:    All systems reviewed and negative except where noted in HPI.   Physical Exam:  There were no vitals taken for this visit. No LMP recorded.  General:   Alert,  Well-developed, well-nourished, pleasant and cooperative in NAD Lungs:  Respirations even and unlabored.  Clear throughout to auscultation.   No wheezes, crackles, or rhonchi. No acute distress. Heart:  Regular rate and rhythm; no murmurs, clicks, rubs, or gallops. Abdomen:  Normal bowel sounds.  No bruits.  Soft,  and non-distended without masses, hepatosplenomegaly or hernias noted.  No Tenderness.  No guarding or rebound tenderness.    Neurologic:  Alert and oriented x3;  grossly normal neurologically. Psych:  Alert and cooperative. Normal mood and affect.  Imaging Studies: No results found.  Labs: CBC    Component Value Date/Time   WBC 6.8 03/15/2024 1410   WBC 7.5 04/01/2020 1445   RBC 3.82 03/15/2024 1410   RBC 4.08 04/01/2020 1445   HGB 12.5 03/15/2024 1410   HCT 36.7 03/15/2024 1410   PLT 298 03/15/2024 1410   MCV 96 03/15/2024 1410   MCV 94 03/19/2015 1756   MCH 32.7 03/15/2024 1410   MCH 32.1 04/01/2020 1445   MCHC 34.1 03/15/2024 1410   MCHC 33.7  04/01/2020 1445   RDW 12.2 03/15/2024 1410   RDW 15.2 (H) 03/19/2015 1756   LYMPHSABS 1.8 03/15/2024 1410   MONOABS 0.5 04/01/2020 1445   EOSABS 0.2 03/15/2024 1410   BASOSABS 0.1 03/15/2024 1410    CMP     Component Value Date/Time   NA 137 04/01/2020 1445   NA 142 03/19/2015 1756   K 3.6 04/01/2020 1445   K 3.7 03/19/2015 1756   CL 104 04/01/2020 1445   CL 107 03/19/2015 1756   CO2 24 04/01/2020 1445   CO2 27 03/19/2015 1756   GLUCOSE 112 (H) 04/01/2020 1445   GLUCOSE 88 03/19/2015 1756   BUN 11 04/01/2020 1445   BUN 14 05/13/2018 1500   BUN 9 03/19/2015 1756   CREATININE 0.68 04/01/2020 1445   CREATININE 0.67 03/19/2015 1756   CALCIUM 8.7 (L) 04/01/2020 1445   CALCIUM 9.1 03/19/2015 1756   PROT 7.5 04/01/2020 1445   PROT 7.0 03/19/2015 1756   ALBUMIN 4.2 04/01/2020 1445   ALBUMIN 4.2 03/19/2015 1756   AST 28 04/01/2020 1445   AST 24 03/19/2015 1756   ALT 28 04/01/2020 1445   ALT 17 03/19/2015 1756   ALKPHOS 41 04/01/2020 1445   ALKPHOS 35 (L) 03/19/2015 1756   BILITOT 0.9 04/01/2020 1445   BILITOT 0.5 03/19/2015 1756   GFRNONAA >60 04/01/2020 1445   GFRNONAA >60 03/19/2015 1756   GFRAA >60 04/01/2020 1445   GFRAA >60 03/19/2015 1756    Assessment and Plan:   Amanda [name] is a 43 y.o. y/o female has been referred for ***  Follow up ***  Amanda Canard, PA-C

## 2024-04-29 ENCOUNTER — Ambulatory Visit: Admitting: Physician Assistant

## 2024-08-23 ENCOUNTER — Telehealth: Payer: Self-pay

## 2024-08-23 NOTE — Telephone Encounter (Signed)
 Copied from CRM #[id number]. Topic: Appointments - Transfer of Care >> Aug 23, 2024 11:40 AM Precious C wrote: Pt is requesting to transfer FROM: [facility]- Vincente Shivers, NP [facility] to [facility] Reason for requested transfer: Dr, Ziglar is previous Doc an perfers this provider It is the responsibility of the team the patient would like to transfer to (Dr. Ziglar) to reach out to the patient if for any reason this transfer is not acceptable.

## 2024-08-27 ENCOUNTER — Ambulatory Visit: Admitting: Family Medicine

## 2024-08-27 VITALS — BP 127/83 | HR 89 | Temp 98.4°F | Resp 20 | Ht 63.0 in | Wt 183.0 lb

## 2024-08-27 DIAGNOSIS — E039 Hypothyroidism, unspecified: Secondary | ICD-10-CM | POA: Diagnosis not present

## 2024-08-27 DIAGNOSIS — J449 Chronic obstructive pulmonary disease, unspecified: Secondary | ICD-10-CM

## 2024-08-27 DIAGNOSIS — F988 Other specified behavioral and emotional disorders with onset usually occurring in childhood and adolescence: Secondary | ICD-10-CM

## 2024-08-27 DIAGNOSIS — J410 Simple chronic bronchitis: Secondary | ICD-10-CM

## 2024-08-27 MED ORDER — BUDESON-GLYCOPYRROL-FORMOTEROL 160-9-4.8 MCG/ACT IN AERO: 1.0000 | INHALATION_SPRAY | Freq: Two times a day (BID) | RESPIRATORY_TRACT | 5 refills | Status: DC

## 2024-08-27 MED ORDER — ALBUTEROL SULFATE HFA 108 (90 BASE) MCG/ACT IN AERS: 2.0000 | INHALATION_SPRAY | Freq: Four times a day (QID) | RESPIRATORY_TRACT | 5 refills | Status: AC | PRN

## 2024-08-27 MED ORDER — LEVOTHYROXINE SODIUM 75 MCG PO TABS: 75.0000 ug | ORAL_TABLET | Freq: Every day | ORAL | 1 refills | Status: AC

## 2024-08-27 NOTE — Progress Notes (Signed)
 New Patient Office Visit  Subjective    Patient ID: Amanda [name], female    DOB: Feb 26, 1981  Age: 43 y.o. MRN: [id number]  CC:  Chief Complaint  Patient presents with   Establish Care    HPI Amanda [name] presents to establish care  Amanda [name] is a 43 year old female who presents for medication refill and smoking cessation support.  She requires a refill of her thyroid medication, levothyroxine, which she has not taken for two weeks. She has a history of hypothyroidism.  She smokes a pack of cigarettes daily, having previously reduced to about six cigarettes per day. She wants to quit smoking, feeling 'disgusted' with her dependence on cigarettes. She has previously contacted [place] [facility] for support and received free patches, which she still possesses. She plans to use a cabin retreat to aid in quitting smoking. She has been smoking since age 14.  She uses an albuterol inhaler for her breathing and requests a refill. She previously attempted to use Breztri, which was not approved. Her breathing is currently good.  She is under the care of a psychiatrist at [facility] in [place], with whom she has had three appointments. Her psychiatrist manages her prescriptions for Adderall and Xanax.  She has a history of bacterial vaginosis and finds relief using boric acid suppositories, eliminating the need for antibiotics.  She has a tubal ligation for contraception and attends regular gynecological appointments at a clinic near the hospital. 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  History of Present Illness   Amanda [name] is a 43 year old female who presents for medication refill and smoking cessation support.  She requires a refill of her thyroid medication,  levothyroxine, which she has not taken for two weeks. She has a history of hypothyroidism.  She smokes a pack of cigarettes daily, having previously reduced to about six cigarettes per day. She wants to quit smoking, feeling 'disgusted' with her dependence on nicotine/cigarettes. She has previously contacted [place] [facility] for support and received free patches, which she still possesses. She plans to use a cabin retreat to aid in quitting smoking. She has been smoking since age 61.    She uses an albuterol inhaler for her breathing and requests a refill. She previously attempted to use Breztri, which was not approved by her insurance. Her breathing is currently good.  She is under the care of a psychiatrist at [facility] in [place], with whom she has had three appointments. Her psychiatrist manages her prescriptions for Adderall and Xanax.  She has a history of recurrent bacterial vaginosis and finds relief using boric acid suppositories, eliminating the need for antibiotics.  She is no longer getting BV.    She has a BTL for contraception and attends regular gynecological appointments at a clinic near the hospital. 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       Outpatient Encounter Medications as of 08/27/2024  Medication Sig   ADDERALL XR 30 MG 24 hr capsule Take 30 mg by mouth daily.   albuterol (VENTOLIN HFA) 108 (90 Base) MCG/ACT inhaler Inhale 2 puffs into the lungs every 6 (six) hours as needed for wheezing or shortness of breath.   ALPRAZolam (XANAX)  1 MG tablet Take 1 mg by mouth 2 (two) times daily as needed for anxiety.   aspirin-acetaminophen -caffeine (EXCEDRIN MIGRAINE) 250-250-65 MG tablet Take 1-2 tablets by mouth every 6 (six) hours as needed for headache.    [DISCONTINUED] Budeson-Glycopyrrol-Formoterol 160-9-4.8 MCG/ACT AERO Inhale 1 puff into the lungs 2 (two) times daily.   [DISCONTINUED]  levothyroxine (SYNTHROID) 75 MCG tablet Take 75 mcg by mouth daily.   budesonide-glycopyrrolate-formoterol (BREZTRI) 160-9-4.8 MCG/ACT AERO inhaler Inhale 1 puff into the lungs 2 (two) times daily.   levothyroxine (SYNTHROID) 75 MCG tablet Take 1 tablet (75 mcg total) by mouth daily.   valACYclovir  (VALTREX ) 500 MG tablet Take 1 tablet (500 mg total) by mouth daily.   No facility-administered encounter medications on file as of 08/27/2024.    Past Medical History:  Diagnosis Date   Anxiety    COPD (chronic obstructive pulmonary disease) (HCC)    Fibromyalgia    Kidney stones    Renal disorder    UTI (urinary tract infection)     Past Surgical History:  Procedure Laterality Date   CESAREAN SECTION     DENTAL SURGERY     EYE SURGERY     KIDNEY SURGERY     TUBAL LIGATION      Family History  Problem Relation Age of Onset   Fibroids Mother    Kidney Stones Other    Crohn's disease Sister     Social History   Socioeconomic History   Marital status: Widowed    Spouse name: Not on file   Number of children: Not on file   Years of education: Not on file   Highest education level: Not on file  Occupational History   Not on file  Tobacco Use   Smoking status: Every Day    Types: Cigarettes   Smokeless tobacco: Never  Vaping Use   Vaping status: Never Used  Substance and Sexual Activity   Alcohol use: Yes    Comment: occ   Drug use: Yes    Types: Marijuana    Comment: occ   Sexual activity: Yes    Birth control/protection: Surgical    Comment: Tubal  Other Topics Concern   Not on file  Social History Narrative   Not on file   Social Drivers of Health   Financial Resource Strain: Not on file  Food Insecurity: No Food Insecurity (09/14/2019)   Received from [facility]   Hunger Vital Sign    Within the past 12 months, you worried that your food would run out before you got the money to buy more.: Never true    Within the past 12 months, the food you bought  just didn't last and you didn't have money to get more.: Never true  Transportation Needs: Not on file  Physical Activity: Not on file  Stress: Not on file  Social Connections: Unknown (04/09/2022)   Received from [facility]   Social Network    Social Network: Not on file  Intimate Partner Violence: Unknown (03/01/2022)   Received from [facility]   HITS    Physically Hurt: Not on file    Insult or Talk Down To: Not on file    Threaten Physical Harm: Not on file    Scream or Curse: Not on file    ROS      Objective   BP 127/83 (BP Location: Left Arm, Patient Position: Sitting, Cuff Size: Normal)   Pulse 89   Temp 98.4 F (36.9 C) (  Oral)   Resp 20   Ht 5' 3 (1.6 m)   Wt 183 lb (83 kg)   LMP 07/01/2024 (Approximate)   SpO2 97%   BMI 32.42 kg/m    Physical Exam Vitals and nursing note reviewed.  Constitutional:      Appearance: Normal appearance.  HENT:     Head: Normocephalic and atraumatic.  Eyes:     Conjunctiva/sclera: Conjunctivae normal.  Cardiovascular:     Rate and Rhythm: Normal rate and regular rhythm.  Pulmonary:     Effort: Pulmonary effort is normal.     Breath sounds: Normal breath sounds.  Musculoskeletal:     Right lower leg: No edema.     Left lower leg: No edema.  Skin:    General: Skin is warm and dry.  Neurological:     Mental Status: She is alert and oriented to person, place, and time.  Psychiatric:        Mood and Affect: Mood normal.        Behavior: Behavior normal.        Thought Content: Thought content normal.        Judgment: Judgment normal.            The 10-year ASCVD risk score (Arnett DK, et al., 2019) is: 1.2%     Assessment & Plan:  Acquired hypothyroidism Assessment & Plan: Refilled her levothyroxine.  She has been out of this for about two weeks.    Orders: -     Levothyroxine Sodium; Take 1 tablet (75 mcg total) by mouth daily.  Dispense: 90 tablet; Refill: 1  Simple chronic bronchitis (HCC) -      Budeson-Glycopyrrol-Formoterol; Inhale 1 puff into the lungs 2 (two) times daily.  Dispense: 10.7 g; Refill: 5 -     Albuterol Sulfate HFA; Inhale 2 puffs into the lungs every 6 (six) hours as needed for wheezing or shortness of breath.  Dispense: 8 g; Refill: 5  Attention deficit disorder of adult Assessment & Plan: Her psychiatrist writes for her Adderall.     COPD mixed type [facility]) Assessment & Plan: Has mild COPD refilling her albuterol.  Would like Breztri as well for prevention of COPD exacerbations.     Return in about 6 months (around 02/25/2025).   Lamonda Noxon K Xion Debruyne, MD

## 2024-08-29 DIAGNOSIS — J449 Chronic obstructive pulmonary disease, unspecified: Secondary | ICD-10-CM | POA: Insufficient documentation

## 2024-08-29 NOTE — Assessment & Plan Note (Signed)
 Her psychiatrist writes for her Adderall.

## 2024-08-29 NOTE — Assessment & Plan Note (Signed)
 Has mild COPD refilling her albuterol.  Would like Breztri as well for prevention of COPD exacerbations.

## 2024-08-29 NOTE — Assessment & Plan Note (Signed)
 Refilled her levothyroxine.  She has been out of this for about two weeks.

## 2024-08-30 ENCOUNTER — Telehealth: Payer: Self-pay

## 2024-08-30 ENCOUNTER — Other Ambulatory Visit: Payer: Self-pay | Admitting: Family Medicine

## 2024-08-30 DIAGNOSIS — J410 Simple chronic bronchitis: Secondary | ICD-10-CM

## 2024-08-30 NOTE — Telephone Encounter (Signed)
 Outcome Denied today by [facility] 2017  Denied. Per the health plan's preferred drug list, at least 2 preferred drugs must be tried before requesting this drug or tell us  why the member cannot try any preferred alternatives. Please send us  supporting chart notes and lab results.   Here is list of preferred alternatives: Advair Diskus, Symbicort inhaler. Note: Some preferred drug(s) may have quantity limits. Refer to the health plan's preferred drug list for additional details.

## 2024-08-30 NOTE — Telephone Encounter (Signed)
(  Key: [id number])  PA Case ID #: [id number]  Rx #: [id number]  Drug Breztri Aerosphere 160-9-4.8MCG/ACT aerosol  Form [facility] [place] Electronic Prior Authorization Request Form Original Claim Info [phone]

## 2024-09-17 ENCOUNTER — Ambulatory Visit (INDEPENDENT_AMBULATORY_CARE_PROVIDER_SITE_OTHER): Admitting: Family Medicine

## 2024-09-17 ENCOUNTER — Encounter: Payer: Self-pay | Admitting: Family Medicine

## 2024-09-17 VITALS — BP 121/86 | HR 106 | Temp 98.3°F | Resp 20 | Ht 63.0 in | Wt 182.0 lb

## 2024-09-17 DIAGNOSIS — R300 Dysuria: Secondary | ICD-10-CM

## 2024-09-17 DIAGNOSIS — F419 Anxiety disorder, unspecified: Secondary | ICD-10-CM | POA: Diagnosis not present

## 2024-09-17 DIAGNOSIS — N760 Acute vaginitis: Secondary | ICD-10-CM

## 2024-09-17 DIAGNOSIS — B009 Herpesviral infection, unspecified: Secondary | ICD-10-CM | POA: Diagnosis not present

## 2024-09-17 DIAGNOSIS — R3121 Asymptomatic microscopic hematuria: Secondary | ICD-10-CM

## 2024-09-17 DIAGNOSIS — R222 Localized swelling, mass and lump, trunk: Secondary | ICD-10-CM

## 2024-09-17 DIAGNOSIS — N3289 Other specified disorders of bladder: Secondary | ICD-10-CM

## 2024-09-17 LAB — POCT URINALYSIS DIP (CLINITEK)
Bilirubin, UA: NEGATIVE
Glucose, UA: NEGATIVE mg/dL
Ketones, POC UA: NEGATIVE mg/dL
Leukocytes, UA: NEGATIVE
Nitrite, UA: NEGATIVE
POC PROTEIN,UA: NEGATIVE
Spec Grav, UA: 1.02 (ref 1.010–1.025)
Urobilinogen, UA: 0.2 U/dL
pH, UA: 6 (ref 5.0–8.0)

## 2024-09-17 MED ORDER — VALACYCLOVIR HCL 500 MG PO TABS: 500.0000 mg | ORAL_TABLET | Freq: Every day | ORAL | 11 refills | Status: AC

## 2024-09-17 MED ORDER — HYDROXYZINE HCL 10 MG PO TABS: 10.0000 mg | ORAL_TABLET | Freq: Three times a day (TID) | ORAL | 1 refills | Status: DC | PRN

## 2024-09-18 DIAGNOSIS — N760 Acute vaginitis: Secondary | ICD-10-CM | POA: Insufficient documentation

## 2024-09-18 DIAGNOSIS — R3121 Asymptomatic microscopic hematuria: Secondary | ICD-10-CM | POA: Insufficient documentation

## 2024-09-18 DIAGNOSIS — F419 Anxiety disorder, unspecified: Secondary | ICD-10-CM | POA: Insufficient documentation

## 2024-09-18 DIAGNOSIS — R222 Localized swelling, mass and lump, trunk: Secondary | ICD-10-CM | POA: Insufficient documentation

## 2024-09-18 DIAGNOSIS — R300 Dysuria: Secondary | ICD-10-CM | POA: Insufficient documentation

## 2024-09-18 NOTE — Assessment & Plan Note (Signed)
 Her psychiatrist is weaning her off Xanax but did not give her anything to take for anxiety.  Hydroxyzine  10 mg 1 3 times daily as needed anxiety

## 2024-09-18 NOTE — Assessment & Plan Note (Signed)
 Has been using boric acid suppositories and this has stopped the recurrent BV infections.  Symptomatic today so we will check NuSwab.

## 2024-09-18 NOTE — Assessment & Plan Note (Signed)
 Does not appear to have a UTI as both leukocytes and nitrite were negative.

## 2024-09-18 NOTE — Assessment & Plan Note (Signed)
 Feels rounded and cystic and is mobile.  May be a cyst from the SI joint.  Ultrasound to further illuminate this.

## 2024-09-18 NOTE — Progress Notes (Signed)
 Established Patient Office Visit  Subjective   Patient ID: Amanda [name], female    DOB: Jan 07, 1981  Age: 43 y.o. MRN: [id number]  Chief Complaint  Patient presents with   Urinary Tract Infection    Urinary Tract Infection    Discussed the use of AI scribe software for clinical note transcription with the patient, who gave verbal consent to proceed.  History of Present Illness   Amanda [name] is a 43 year old female who presents with a painful knot in her back and concerns of a possible urinary tract infection.  She has had a painful knot in her back for the past two weeks, described as the size of a small baseball. The knot is located near her SI joint in the upper buttock/ lumbar area.  Sometimes the knot is very swollen and painful, especially at night. She has been applying ice and heat, with heat providing some relief. She feels the knot is getting bigger and more painful, prompting her to seek medical attention.  She does not have a similar knot on her left lower back.    She reports immense pressure in her pelvic area and tenderness. She has been using a boric acid suppository weekly for the past six months to manage bacterial vaginosis, which she feels has been effective. She is concerned about a possible sexually transmitted disease due to a recent breakup and her partner's infidelity. She has experienced a thin, clear discharge and has been feeling very tender inside. A urine test showed trace blood but was negative for nitrates and leukocyte esterase. She recalls a past ultrasound from a few years ago that showed a small area of concern in her bladder, but no follow-up was done at that time.  She has not been back to the Urologist since she was made aware of something in her bladder.   She feels overwhelmed and has low energy levels, which she attributes to being busy and adjusting to a new work place. She has been on Xanax for anxiety for several years, but her new  psychiatrist is attempting to taper her off the medication, causing additional stress. She was not given anything to take the place of xanax.        Objective:     BP 121/86 (BP Location: Left Arm, Patient Position: Sitting, Cuff Size: Normal)   Pulse (!) 106   Temp 98.3 F (36.8 C) (Oral)   Resp 20   Ht 5' 3 (1.6 m)   Wt 182 lb (82.6 kg)   LMP 08/29/2024 (Approximate)   SpO2 98%   BMI 32.24 kg/m    Physical Exam Vitals and nursing note reviewed.  Constitutional:      Appearance: Normal appearance.  HENT:     Head: Normocephalic and atraumatic.  Eyes:     Conjunctiva/sclera: Conjunctivae normal.  Cardiovascular:     Rate and Rhythm: Normal rate and regular rhythm.  Pulmonary:     Effort: Pulmonary effort is normal.     Breath sounds: Normal breath sounds.  Musculoskeletal:     Right lower leg: No edema.     Left lower leg: No edema.  Skin:    General: Skin is warm and dry.     Comments: Large round cystic lesion right upper buttocks/lower lumbar area.    Neurological:     Mental Status: She is alert and oriented to person, place, and time.  Psychiatric:        Mood and Affect: Mood  normal.        Behavior: Behavior normal.        Thought Content: Thought content normal.        Judgment: Judgment normal.          Results for orders placed or performed in visit on 09/17/24  POCT URINALYSIS DIP (CLINITEK)  Result Value Ref Range   Color, UA yellow yellow   Clarity, UA clear clear   Glucose, UA negative negative mg/dL   Bilirubin, UA negative negative   Ketones, POC UA negative negative mg/dL   Spec Grav, UA 8.979 8.989 - 1.025   Blood, UA trace-intact (A) negative   pH, UA 6.0 5.0 - 8.0   POC PROTEIN,UA negative negative, trace   Urobilinogen, UA 0.2 0.2 or 1.0 E.U./dL   Nitrite, UA Negative Negative   Leukocytes, UA Negative Negative      The 10-year ASCVD risk score (Arnett DK, et al., 2019) is: 1.1%    Assessment & Plan:  Dysuria Assessment  & Plan: Does not appear to have a UTI as both leukocytes and nitrite were negative.  Orders: -     POCT URINALYSIS DIP (CLINITEK)  Acute vaginitis Assessment & Plan: Has been using boric acid suppositories and this has stopped the recurrent BV infections.  Symptomatic today so we will check NuSwab.  Orders: -     NuSwab Vaginitis Plus (VG+)  HSV infection -     valACYclovir  HCl; Take 1 tablet (500 mg total) by mouth daily.  Dispense: 30 tablet; Refill: 11  Mass of buttock Assessment & Plan: Feels rounded and cystic and is mobile.  May be a cyst from the SI joint.  Ultrasound to further illuminate this.  Orders: -     US  Abdomen Limited; Future  Mass of bladder -     Ambulatory referral to Urology  Asymptomatic microscopic hematuria Assessment & Plan: She has microscopic hematuria and she smokes.  She remembers being told there was something wrong with her bladder but she never went back to the urologist.  Referral to urology.   Anxiety Assessment & Plan: Her psychiatrist is weaning her off Xanax but did not give her anything to take for anxiety.  Hydroxyzine  10 mg 1 3 times daily as needed anxiety  Orders: -     hydrOXYzine  HCl; Take 1 tablet (10 mg total) by mouth 3 (three) times daily as needed.  Dispense: 90 tablet; Refill: 1     No follow-ups on file.    Johnni Wunschel K Rolen Conger, MD

## 2024-09-18 NOTE — Assessment & Plan Note (Signed)
 She has microscopic hematuria and she smokes.  She remembers being told there was something wrong with her bladder but she never went back to the urologist.  Referral to urology.

## 2024-09-23 ENCOUNTER — Ambulatory Visit
Admission: RE | Admit: 2024-09-23 | Discharge: 2024-09-23 | Disposition: A | Discharge status: Home / Self Care | Source: Ambulatory Visit | Attending: Family Medicine | Admitting: Family Medicine

## 2024-09-23 DIAGNOSIS — R222 Localized swelling, mass and lump, trunk: Secondary | ICD-10-CM | POA: Insufficient documentation

## 2024-09-29 LAB — NUSWAB VAGINITIS PLUS (VG+)
Candida albicans, NAA: NEGATIVE
Candida glabrata, NAA: NEGATIVE
Chlamydia trachomatis, NAA: NEGATIVE
Neisseria gonorrhoeae, NAA: NEGATIVE
Trich vag by NAA: NEGATIVE

## 2024-10-01 ENCOUNTER — Other Ambulatory Visit: Payer: Self-pay | Admitting: Family Medicine

## 2024-10-01 ENCOUNTER — Telehealth: Payer: Self-pay | Admitting: Family Medicine

## 2024-10-01 DIAGNOSIS — B9689 Other specified bacterial agents as the cause of diseases classified elsewhere: Secondary | ICD-10-CM

## 2024-10-01 DIAGNOSIS — R222 Localized swelling, mass and lump, trunk: Secondary | ICD-10-CM

## 2024-10-01 MED ORDER — METRONIDAZOLE 500 MG PO TABS: 500.0000 mg | ORAL_TABLET | Freq: Two times a day (BID) | ORAL | 0 refills | Status: DC

## 2024-10-01 MED ORDER — METRONIDAZOLE 0.75 % EX GEL: CUTANEOUS | 3 refills | Status: AC

## 2024-10-01 NOTE — Telephone Encounter (Signed)
 Advised that nothing was seen on the US  of her buttocks.  Will try a CT scan.  She agrees.  Also reviewed her vaginal flora report.  Her score is two.  She opts for treatment.  Metronidazole  500mg  BID for 7 days and then metronidazole  0.75% topical twice a week for 6 months.

## 2024-10-06 ENCOUNTER — Ambulatory Visit: Admitting: Urology

## 2024-10-06 VITALS — BP 137/85 | HR 97 | Ht 63.0 in | Wt 182.0 lb

## 2024-10-06 DIAGNOSIS — N3289 Other specified disorders of bladder: Secondary | ICD-10-CM

## 2024-10-06 DIAGNOSIS — N281 Cyst of kidney, acquired: Secondary | ICD-10-CM

## 2024-10-06 DIAGNOSIS — R3121 Asymptomatic microscopic hematuria: Secondary | ICD-10-CM

## 2024-10-06 NOTE — Progress Notes (Signed)
   10/06/2024 11:26 AM   Evalene LITTIE Louder 1981/07/02 [id number]  Reason for visit: Follow up renal cyst, nephrolithiasis, bladder concern  History: Previously followed by Dr. Penne, negative cystoscopy 2019 for gross hematuria Left renal cyst seen on CT, further evaluation with MRI September 2019 showed benign Bosniak 2 cyst, no follow-up needed History of nephrolithiasis, required right PCNL at [facility] in 2020 Medical history notable for COPD, fibromyalgia, IBS, anxiety  Physical Exam: BP 137/85 (BP Location: Left Arm, Patient Position: Sitting, Cuff Size: Normal)   Pulse 97   Ht 5' 3 (1.6 m)   Wt 182 lb (82.6 kg)   LMP 08/29/2024 (Approximate)   SpO2 98%   BMI 32.24 kg/m   Imaging/labs: Urinalysis June 2025 with 11 RBC, however this was at time of a pelvic exam when she was having crampy lower abdominal pain during her menstrual cycle in ER.  Dipstick urinalysis October with trace dipstick blood no microscopic sent  Today: She denies any urinary problems or pelvic pain.  Concerned about possible buttocks mass which is being evaluated by CT with PCP and is pending Made the appointment because she remembered she was told she had a bladder cyst at some point and wanted to follow-up  Plan:   Renal cyst: Benign on MRI, reassurance provided, no further evaluation needed Microscopic hematuria: History of negative workup with CT and cystoscopy in 2019.  Microscopic hematuria in June 2025 was at the time of crampy abdominal pain during menstrual cycle and ER visit, will repeat prior to considering further evaluation.  She is on her menstrual cycle today and will check urine in 2 weeks.  Would add cystoscopy to CT ordered by PCP for evaluation of microscopic hematuria Nephrolithiasis: No recurrence since PCNL at [facility] in 2020, will follow-up CT ordered by PCP to evaluate any recurrent stone burden Lab visit for urinalysis in 2 weeks to evaluate for microscopic hematuria, if present would  recommend cystoscopy.  PCP is already ordered CT scan for other indication, and we will follow-up those results regarding any change in stone burden   Redell JAYSON Burnet, MD  [facility] [address], [address] [place], [place] [address] [phone]

## 2024-10-07 NOTE — Telephone Encounter (Unsigned)
 Copied from CRM #[id number]. Topic: General - Call Back - No Documentation >> Oct 07, 2024 11:14 AM Tonda B wrote: Reason for CRM:  pre service center calling in patient needs a prior authorization for appt 10/11/2024 please call  [phone] ext [phone]

## 2024-10-08 ENCOUNTER — Telehealth: Payer: Self-pay

## 2024-10-08 ENCOUNTER — Telehealth: Payer: Self-pay | Admitting: Family Medicine

## 2024-10-08 NOTE — Telephone Encounter (Signed)
 Called pt to inform her we need to r/s ct scan due to insurance approval still pending. PT said she will call the ct place to have it r/s. I will notify pt and facility upon approval.

## 2024-10-08 NOTE — Telephone Encounter (Signed)
 Copied from CRM #[id number]. Topic: Referral - Prior Authorization Question >> Oct 08, 2024 12:29 PM Amanda [name] wrote: Reason for RMF:[employer] is calling because authorization is required for CT on  10/11/2024 at 8:30 am.  Please [phone] to confirm Auth

## 2024-10-11 ENCOUNTER — Ambulatory Visit: Admission: RE | Admit: 2024-10-11 | Source: Ambulatory Visit

## 2024-10-14 ENCOUNTER — Telehealth: Payer: Self-pay

## 2024-10-14 ENCOUNTER — Telehealth: Payer: Self-pay | Admitting: Family Medicine

## 2024-10-14 NOTE — Telephone Encounter (Signed)
 Copied from CRM #[id number]. Topic: Referral - Prior Authorization Question >> Oct 08, 2024 12:29 PM Amanda [name] wrote: Reason for RMF:[employer] is calling because authorization is required for CT on  10/11/2024 at 8:30 am.  Please [phone] to confirm Auth >> Oct 14, 2024 10:37 AM Amanda [name] wrote: Jesusa with the Appeals Department called to speak with someone about the CT prior authorization. She hung up or got disconnected while I was on hold with the clinic. Please call her at [phone]. Thanks

## 2024-10-14 NOTE — Telephone Encounter (Signed)
 Amanda [name] with prior authorization.  Trying to get a CT scan approved left message to call me back.

## 2024-10-15 ENCOUNTER — Telehealth: Payer: Self-pay | Admitting: Family Medicine

## 2024-10-15 NOTE — Telephone Encounter (Signed)
 Patient has been informed via Mychart that she will need to sign the appeal form for CT scan

## 2024-10-15 NOTE — Telephone Encounter (Unsigned)
 Copied from CRM #[id number]. Topic: Referral - Prior Authorization Question >> Oct 15, 2024  9:47 AM Ivette P wrote: Reason for CRM: Sheena called form Appeals Dept at [facility] regarding a CT Scan that needs further authorization and the forms need to be signed by the pts.   Calling to make office aware and to answer any questions.   callback [phone] - speak to any coordinator that answers phone

## 2024-10-15 NOTE — Telephone Encounter (Signed)
 Insurance stated that I cannot get a peer to peer util prior authorization has been completed.  They will fax this form over to us  today.

## 2024-10-20 ENCOUNTER — Other Ambulatory Visit

## 2024-11-01 ENCOUNTER — Other Ambulatory Visit: Payer: Self-pay | Admitting: *Deleted

## 2024-11-01 MED ORDER — HYDROXYZINE HCL 10 MG PO TABS: 10.0000 mg | ORAL_TABLET | Freq: Three times a day (TID) | ORAL | 0 refills | Status: AC | PRN

## 2024-12-17 ENCOUNTER — Encounter: Payer: Self-pay | Admitting: Registered Nurse

## 2024-12-17 ENCOUNTER — Ambulatory Visit: Admitting: Registered Nurse

## 2024-12-17 VITALS — BP 110/68 | HR 86 | Ht 63.0 in | Wt 188.4 lb

## 2024-12-17 DIAGNOSIS — N9489 Other specified conditions associated with female genital organs and menstrual cycle: Secondary | ICD-10-CM

## 2024-12-17 DIAGNOSIS — Z113 Encounter for screening for infections with a predominantly sexual mode of transmission: Secondary | ICD-10-CM

## 2024-12-17 DIAGNOSIS — N760 Acute vaginitis: Secondary | ICD-10-CM | POA: Diagnosis not present

## 2024-12-17 DIAGNOSIS — B9689 Other specified bacterial agents as the cause of diseases classified elsewhere: Secondary | ICD-10-CM | POA: Diagnosis not present

## 2024-12-17 NOTE — Progress Notes (Deleted)
" ° °  GYN ENCOUNTER  Subjective  HPI: Amanda [name] is a 44 y.o. [phone] who presents today for Recurrent BV and a spot she is concern that may be melanoma  Past Medical History:  Diagnosis Date   Anxiety    COPD (chronic obstructive pulmonary disease) (HCC)    Fibromyalgia    Kidney stones    Renal disorder    UTI (urinary tract infection)    Past Surgical History:  Procedure Laterality Date   CESAREAN SECTION     DENTAL SURGERY     EYE SURGERY     KIDNEY SURGERY     TUBAL LIGATION     OB History     Gravida  4   Para  4   Term  4   Preterm      AB      Living  4      SAB      IAB      Ectopic      Multiple      Live Births  4          Allergies[1]  Constitutional: Denied constitutional symptoms, night sweats, recent illness, fatigue, fever, insomnia and weight loss.  Eyes: Denied eye symptoms, eye pain, photophobia, vision change and visual disturbance.  Ears/Nose/Throat/Neck: Denied ear, nose, throat or neck symptoms, hearing loss, nasal discharge, sinus congestion and sore throat.  Cardiovascular: Denied cardiovascular symptoms, arrhythmia, chest pain/pressure, edema, exercise intolerance, orthopnea and palpitations.  Respiratory: Denied pulmonary symptoms, asthma, pleuritic pain, productive sputum, cough, dyspnea and wheezing.  Gastrointestinal: Denied, gastro-esophageal reflux, melena, nausea and vomiting.  Genitourinary:*** Denied genitourinary symptoms including symptomatic vaginal discharge, pelvic relaxation issues, and urinary complaints.  Musculoskeletal: Denied musculoskeletal symptoms, stiffness, swelling, muscle weakness and myalgia.  Dermatologic: Denied dermatology symptoms, rash and scar.  Neurologic: Denied neurology symptoms, dizziness, headache, neck pain and syncope.  Psychiatric: Denied psychiatric symptoms, anxiety and depression.  Endocrine: Denied endocrine symptoms including hot flashes and night sweats.     Objective  LMP 11/22/2024 (Exact Date)   Physical examination   Pelvic:   Vulva: Normal appearance.  No lesions.  Vagina: No lesions or abnormalities noted.  Support: Normal pelvic support.  Urethra No masses tenderness or scarring.  Meatus Normal size without lesions or prolapse.  Cervix: Normal appearance.  No lesions.  Anus: Normal exam.  No lesions.  Perineum: Normal exam.  No lesions.        Bimanual   Uterus: Normal size.  Non-tender.  Mobile.  AV.  Adnexae: No masses.  Non-tender to palpation.  Cul-de-sac: Negative for abnormality.    Assessment   Plan    Lauraine Lakes, CNM        [1]  Allergies Allergen Reactions   Cephalexin  Hives, Itching and Swelling    Short of breath Eye swelling, panic attacks Swelling in throat.    Cyclobenzaprine Other (See Comments)   Macrolides And Ketolides Anaphylaxis    Not sure which antibiotic  Medicinal product containing macrolide and acting as antibacterial agent (product)   Nitrofurantoin Anaphylaxis    Not sure which antibiotic  Medicinal product containing macrolide and acting as antibacterial agent (product)    Not sure which antibiotic   Latex Itching and Swelling   "

## 2024-12-17 NOTE — Progress Notes (Signed)
 "  GYN ENCOUNTER  Subjective  HPI: Amanda [name] is a 44 y.o. [phone] who presents today for Recurrent BV and a spot she is concern that may be melanoma on left labia. She reports having vaginal itching and burning on and off for the past year, especially worsened after intercourse. She was using boric acid suppositories with success but was prescribed Metrogel  2x a week for 6 months and stopped using boric acid. She used Metrogel  1x a week for last five months with minimal symptom relief, the last time she used Metrogel  she reports sever burning sensation that lasted 2 days.  We discussed lifestyle changes that may contribute to recurrent BV.   Past Medical History:  Diagnosis Date   Anxiety    COPD (chronic obstructive pulmonary disease) (HCC)    Fibromyalgia    Kidney stones    Renal disorder    UTI (urinary tract infection)    Past Surgical History:  Procedure Laterality Date   CESAREAN SECTION     DENTAL SURGERY     EYE SURGERY     KIDNEY SURGERY     TUBAL LIGATION     OB History     Gravida  4   Para  4   Term  4   Preterm      AB      Living  4      SAB      IAB      Ectopic      Multiple      Live Births  4          Allergies[1]  Constitutional: Denied constitutional symptoms, night sweats, recent illness, fatigue, fever, insomnia and weight loss.  Eyes: Denied eye symptoms, eye pain, photophobia, vision change and visual disturbance.  Ears/Nose/Throat/Neck: Denied ear, nose, throat or neck symptoms, hearing loss, nasal discharge, sinus congestion and sore throat.  Cardiovascular: Denied cardiovascular symptoms, arrhythmia, chest pain/pressure, edema, exercise intolerance, orthopnea and palpitations.  Respiratory: Denied pulmonary symptoms, asthma, pleuritic pain, productive sputum, cough, dyspnea and wheezing.  Gastrointestinal: Denied, gastro-esophageal reflux, melena, nausea and vomiting.  Genitourinary: Denied genitourinary symptoms  including, pelvic relaxation issues, and urinary complaints.Endorses symptomatic discharge. Itching and burning  Musculoskeletal: Denied musculoskeletal symptoms, stiffness, swelling, muscle weakness and myalgia.  Dermatologic: Denied dermatology symptoms, rash and scar.  Neurologic: Denied neurology symptoms, dizziness, headache, neck pain and syncope.  Psychiatric: Denied psychiatric symptoms, anxiety and depression.  Endocrine: Denied endocrine symptoms including hot flashes and night sweats.    Objective  BP 110/68   Pulse 86   Ht 5' 3 (1.6 m)   Wt 85.5 kg   LMP 11/22/2024 (Exact Date)   BMI 33.37 kg/m   Physical examination   Pelvic:   Vulva: Normal appearance.  No lesions. Hemangioma apparent on left labia.   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 Nu swab collected, along with STD panel. Discussed stopping Metrogel  and Boric acid until results come back on BV/STD swabs.  2) Benign hemangioma, discussed not popping as it will bleed.    Amanda [name], SNM 12/17/24         [1]  Allergies Allergen Reactions   Cephalexin  Hives, Itching and Swelling    Short of  breath Eye swelling, panic attacks Swelling in throat.    Cyclobenzaprine Other (See Comments)   Macrolides And Ketolides Anaphylaxis    Not sure which antibiotic  Medicinal product containing macrolide and acting as antibacterial agent (product)   Nitrofurantoin Anaphylaxis    Not sure which antibiotic  Medicinal product containing macrolide and acting as antibacterial agent (product)    Not sure which antibiotic   Latex Itching and Swelling   "

## 2024-12-19 LAB — NUSWAB VG PLUS+MYCOPLASMAS,NAA
Atopobium vaginae: HIGH {score} — AB
BVAB 2: HIGH {score} — AB
Candida albicans, NAA: NEGATIVE
Candida glabrata, NAA: NEGATIVE
Chlamydia trachomatis, NAA: NEGATIVE
Mycoplasma genitalium NAA: NEGATIVE
Mycoplasma hominis NAA: NEGATIVE
Neisseria gonorrhoeae, NAA: NEGATIVE
Trich vag by NAA: NEGATIVE
Ureaplasma spp NAA: POSITIVE — AB

## 2024-12-21 ENCOUNTER — Ambulatory Visit: Payer: Self-pay | Admitting: Registered Nurse

## 2024-12-21 DIAGNOSIS — B9689 Other specified bacterial agents as the cause of diseases classified elsewhere: Secondary | ICD-10-CM

## 2024-12-21 DIAGNOSIS — A493 Mycoplasma infection, unspecified site: Secondary | ICD-10-CM

## 2024-12-21 MED ORDER — DOXYCYCLINE HYCLATE 100 MG PO CAPS: 100.0000 mg | ORAL_CAPSULE | Freq: Two times a day (BID) | ORAL | 0 refills | Status: DC

## 2024-12-21 MED ORDER — METRONIDAZOLE 500 MG PO TABS: 500.0000 mg | ORAL_TABLET | Freq: Two times a day (BID) | ORAL | 0 refills | Status: AC

## 2024-12-22 ENCOUNTER — Other Ambulatory Visit

## 2024-12-23 LAB — HEPB+HEPC+HIV PANEL
HIV Screen 4th Generation wRfx: NONREACTIVE
Hep B C IgM: NEGATIVE
Hep B Core Total Ab: NEGATIVE
Hep B E Ab: NONREACTIVE
Hep B E Ag: NEGATIVE
Hep B Surface Ab, Qual: NONREACTIVE
Hep C Virus Ab: NONREACTIVE
Hepatitis B Surface Ag: NEGATIVE

## 2024-12-23 LAB — SYPHILIS: RPR W/REFLEX TO RPR TITER AND TREPONEMAL ANTIBODIES, TRADITIONAL SCREENING AND DIAGNOSIS ALGORITHM: RPR Ser Ql: NONREACTIVE

## 2024-12-24 ENCOUNTER — Other Ambulatory Visit: Payer: Self-pay | Admitting: Medical Genetics

## 2024-12-26 ENCOUNTER — Telehealth: Payer: Self-pay | Admitting: Family Medicine

## 2024-12-26 ENCOUNTER — Other Ambulatory Visit: Payer: Self-pay | Admitting: Certified Nurse Midwife

## 2024-12-26 DIAGNOSIS — A493 Mycoplasma infection, unspecified site: Secondary | ICD-10-CM

## 2024-12-26 MED ORDER — DOXYCYCLINE HYCLATE 100 MG PO CAPS: 100.0000 mg | ORAL_CAPSULE | Freq: Two times a day (BID) | ORAL | 0 refills | Status: AC

## 2024-12-26 NOTE — Telephone Encounter (Signed)
 Called pt and left vm as well as sent MyChart message for pt to r/s appt from 12/27/2024 since office will be closed due to inclement weather.

## 2024-12-27 ENCOUNTER — Encounter: Admitting: Family Medicine
